# Patient Record
Sex: Female | Born: 1990 | Hispanic: No | Marital: Married | Smoking: Never smoker
Health system: Southern US, Community
[De-identification: ages and names within clinical notes are randomized; demographics above are authoritative.]

## PROBLEM LIST (undated history)

## (undated) DIAGNOSIS — Z789 Other specified health status: Secondary | ICD-10-CM

## (undated) HISTORY — PX: NO PAST SURGERIES: SHX2092

## (undated) HISTORY — DX: Other specified health status: Z78.9

---

## 2020-02-02 LAB — HM PAP SMEAR: HM Pap smear: NORMAL

## 2021-07-01 ENCOUNTER — Encounter: Payer: Self-pay | Admitting: Internal Medicine

## 2021-07-01 ENCOUNTER — Other Ambulatory Visit: Payer: Self-pay

## 2021-07-01 ENCOUNTER — Ambulatory Visit: Payer: 59 | Admitting: Internal Medicine

## 2021-07-01 VITALS — BP 93/53 | HR 57 | Temp 97.7°F | Resp 17 | Ht 60.5 in | Wt 147.4 lb

## 2021-07-01 DIAGNOSIS — Z0000 Encounter for general adult medical examination without abnormal findings: Secondary | ICD-10-CM | POA: Diagnosis not present

## 2021-07-01 DIAGNOSIS — Z6825 Body mass index (BMI) 25.0-25.9, adult: Secondary | ICD-10-CM | POA: Insufficient documentation

## 2021-07-01 DIAGNOSIS — E663 Overweight: Secondary | ICD-10-CM

## 2021-07-01 DIAGNOSIS — Z6828 Body mass index (BMI) 28.0-28.9, adult: Secondary | ICD-10-CM

## 2021-07-01 NOTE — Patient Instructions (Signed)
Mantenimiento de Technical sales engineer en [place] Maintenance, Female Adoptar un estilo de vida saludable y recibir atencin preventiva son importantes para promover la salud y Musician. Consulte al mdico sobre: El esquema adecuado para hacerse pruebas y exmenes peridicos. Cosas que puede hacer por su cuenta para prevenir enfermedades y [employer]. Qu debo saber sobre la dieta, el peso y el ejercicio? Consuma una dieta saludable  Consuma una dieta que incluya muchas verduras, frutas, productos lcteos con bajo contenido de [place] y Advertising account planner. No consuma muchos alimentos ricos en grasas slidas, azcares agregados o sodio.  Mantenga un peso saludable El ndice de masa muscular [facility]) se [place] and the [place] para identificar problemas de [place]. Proporciona una estimacin de la grasa corporal basndose en el peso y la altura. Su mdico puede ayudarle a Radiation protection practitioner [place] y a Scientist, forensic o Theatre manager unpeso saludable. Haga ejercicio con regularidad Haga ejercicio con regularidad. Esta es una de las prcticas ms importantes que puede hacer por su salud. La [employer] de los adultos deben seguir estas pautas: Optometrist, al menos, 150 minutos de actividad fsica por semana. El ejercicio debe aumentar la frecuencia cardaca y Nature conservation officer transpirar (ejercicio de intensidad moderada). Hacer ejercicios de fortalecimiento por lo [employer] por semana. Agregue esto a su plan de ejercicio de intensidad moderada. Pasar menos tiempo sentados. Incluso la actividad fsica ligera puede ser beneficiosa. Controle sus niveles de colesterol y lpidos en la sangre Comience a realizarse anlisis de lpidos y colesterol en la sangre a los20 aos y luego reptalos cada 5 aos. Hgase controlar los niveles de colesterol con mayor frecuencia si: Sus niveles de lpidos y colesterol son altos. Es mayor de 22 aos. Presenta un alto riesgo de padecer enfermedades cardacas. Qu debo saber sobre las pruebas de deteccin del  cncer? Segn su historia clnica y sus antecedentes familiares, es posible que deba realizarse pruebas de deteccin del cncer en diferentes edades. Esto puede incluir pruebas de deteccin de lo siguiente: Cncer de mama. Cncer de cuello uterino. Cncer colorrectal. Cncer de piel. Cncer de pulmn. Qu debo saber sobre la enfermedad cardaca, la diabetes y la hipertensinarterial? Presin arterial y enfermedad cardaca La hipertensin arterial causa enfermedades cardacas y [place] el riesgo de accidente cerebrovascular. Es ms probable que esto se manifieste en las personas que tienen lecturas de presin arterial alta, tienen ascendencia africana o tienen sobrepeso. Hgase controlar la presin arterial: Cada 3 a 5 aos si tiene entre 18 y 3 aos. Todos los aos si es mayor de 40 aos. Diabetes Realcese exmenes de deteccin de la diabetes con regularidad. Este anlisis revisa el nivel de azcar en la sangre en [place]. Hgase las pruebas de deteccin: Cada tres aos despus de los 22 aos de edad si tiene un peso normal y un bajo riesgo de padecer diabetes. Con ms frecuencia y a partir de [place] edad inferior si tiene sobrepeso o un alto riesgo de padecer diabetes. Qu debo saber sobre la prevencin de infecciones? Hepatitis B Si tiene un riesgo ms alto de contraer hepatitis B, debe someterse a un examen de deteccin de este virus. Hable con el mdico para averiguar si tiene riesgode contraer la infeccin por hepatitis B. Hepatitis C Se recomienda el anlisis a: [employer] 1945 y 1965. Todas las personas que tengan un riesgo de haber contrado hepatitis C. Enfermedades de transmisin sexual (ETS) Hgase las pruebas de Programme researcher, broadcasting/film/video de ITS, incluidas la gonorrea y la clamidia, si: Es sexualmente activa y es Garment/textile technologist de 24  aos. Es mayor de 46 aos, y el mdico le informa que corre riesgo de tener este tipo de infecciones. La actividad sexual ha cambiado desde que le  hicieron la ltima prueba de deteccin y tiene un riesgo mayor de Best boy clamidia o Radio broadcast assistant. Pregntele al mdico si usted tiene riesgo. Pregntele al mdico si usted tiene un alto riesgo de Museum/gallery curator VIH. El mdico tambin puede recomendarle un medicamento recetado para ayudar a evitar la infeccin por el VIH. Si elige tomar medicamentos para prevenir el VIH, primero debe [employer] de deteccin del VIH. Luego debe hacerse anlisis cada 3 meses mientras est tomando los medicamentos. Embarazo Si est por dejar de Librarian, academic (fase premenopusica) y usted puede quedar [place], busque asesoramiento antes de [place]. Tome de 400 a 800 microgramos (mcg) de cido [employer] si [place]. Pida mtodos de control de la natalidad (anticonceptivos) si desea evitar un embarazo no deseado. Osteoporosis y [place] La osteoporosis es una enfermedad en la que los huesos pierden los minerales y la fuerza por el avance de la edad. El resultado pueden ser fracturas en los [place]. Si tiene 47 aos o ms, o si est en riesgo de sufrir osteoporosis y fracturas, pregunte a su mdico si debe: Hacerse pruebas de deteccin de prdida sea. Tomar un suplemento de calcio o de vitamina D para reducir el riesgo de fracturas. Recibir terapia de reemplazo hormonal (TRH) para tratar los sntomas de la menopausia. Siga estas instrucciones en su casa: Estilo de vida No consuma ningn producto que contenga nicotina o tabaco, como cigarrillos, cigarrillos electrnicos y tabaco de Higher education careers adviser. Si necesita ayuda para dejar de fumar, consulte al mdico. No consuma drogas. No comparta agujas. Solicite ayuda a su mdico si necesita apoyo o informacin para abandonar las drogas. Consumo de alcohol No beba alcohol si: Su mdico le indica no hacerlo. Est embarazada, puede estar embarazada o est tratando de [place]. Si bebe alcohol: Limite la cantidad que consume de 0 a 1 medida por  da. Limite la ingesta si est amamantando. Est atento a la cantidad de alcohol que hay en las bebidas que toma. En los [place], una medida equivale a una botella de cerveza de 12 oz (355 ml), un vaso de vino de 5 oz (148 ml) o un vaso de una bebida alcohlica de alta graduacin de 1 oz (44 ml). Instrucciones generales Realcese los estudios de rutina de la salud, dentales y de Public librarian. [place]. Infrmele a su mdico si: Se siente deprimida con frecuencia. Alguna vez ha sido vctima de [place] o no se siente segura en su casa. Resumen Adoptar un estilo de vida saludable y recibir atencin preventiva son importantes para promover la salud y Musician. Siga las instrucciones del mdico acerca de una dieta saludable, el ejercicio y la realizacin de pruebas o exmenes para Engineer, building services. Siga las instrucciones del mdico con respecto al control del colesterol y la presin arterial. Esta informacin no tiene Marine scientist el consejo del mdico. Asegresede hacerle al mdico cualquier pregunta que tenga. Document Revised: 11/09/2018 Document Reviewed: 11/09/2018 Elsevier Patient Education  [place].

## 2021-07-01 NOTE — Progress Notes (Signed)
HPI  Patient presents the clinic today to establish care.  She would like her annual exam today.  Flu: 08/2019 Tetanus: 11/2020 Pap Smear: 2021, [place] Dentist: biannually  Diet: She does eat meat. She consumes fruits and veggies. She does eat some fried foods. She drinks mostly water. Exercise: None  History reviewed. No pertinent past medical history.  No current outpatient medications on file.   No current facility-administered medications for this visit.    No Known Allergies  History reviewed. No pertinent family history.  Social History   Socioeconomic History   Marital status: Married    Spouse name: Not on file   Number of children: Not on file   Years of education: Not on file   Highest education level: Not on file  Occupational History   Not on file  Tobacco Use   Smoking status: Never   Smokeless tobacco: Not on file  Vaping Use   Vaping Use: Never used  Substance and Sexual Activity   Alcohol use: Never   Drug use: Never   Sexual activity: Never  Other Topics Concern   Not on file  Social History Narrative   Not on file   Social Determinants of Health   Financial Resource Strain: Not on file  Food Insecurity: Not on file  Transportation Needs: Not on file  Physical Activity: Not on file  Stress: Not on file  Social Connections: Not on file  Intimate Partner Violence: Not on file    ROS:  Constitutional: Denies fever, malaise, fatigue, headache or abrupt weight changes.  HEENT: Denies eye pain, eye redness, ear pain, ringing in the ears, wax buildup, runny nose, nasal congestion, bloody nose, or sore throat. Respiratory: Denies difficulty breathing, shortness of breath, cough or sputum production.   Cardiovascular: Denies chest pain, chest tightness, palpitations or swelling in the hands or feet.  Gastrointestinal: Denies abdominal pain, bloating, constipation, diarrhea or blood in the stool.  GU: Denies frequency, urgency, pain with  urination, blood in urine, odor or discharge. Musculoskeletal: Denies decrease in range of motion, difficulty with gait, muscle pain or joint pain and swelling.  Skin: Denies redness, rashes, lesions or ulcercations.  Neurological: Denies dizziness, difficulty with memory, difficulty with speech or problems with balance and coordination.  Psych: Denies anxiety, depression, SI/HI.  No other specific complaints in a complete review of systems (except as listed in HPI above).  PE:  BP (!) 93/53 (BP Location: Right Arm, Patient Position: Sitting, Cuff Size: Normal)   Pulse (!) 57   Temp 97.7 F (36.5 C) (Temporal)   Resp 17   Ht 5' 0.5" (1.537 m)   Wt 147 lb 6.4 oz (66.9 kg)   LMP 06/23/2021   SpO2 100%   BMI 28.31 kg/m  Wt Readings from Last 3 Encounters:  07/01/21 147 lb 6.4 oz (66.9 kg)    General: Appears her stated age, overweight, in NAD. HEENT: Head: normal shape and size; Eyes: sclera white and EOMs intact;  Neck: Neck supple, trachea midline. No masses, lumps or thyromegaly present.  Cardiovascular: Normal rate and rhythm. S1,S2 noted.  No murmur, rubs or gallops noted. No JVD or BLE edema. Pulmonary/Chest: Normal effort and positive vesicular breath sounds. No respiratory distress. No wheezes, rales or ronchi noted.  Abdomen: Soft and nontender. Normal bowel sounds, no bruits noted. No distention or masses noted. Liver, spleen and kidneys non palpable. Musculoskeletal: Strength 5/5 BUE/BLE. No difficulty with gait.  Neurological: Alert and oriented. Cranial nerves II-XII grossly  intact. Coordination normal.  Psychiatric: Mood and affect normal. Behavior is normal. Judgment and thought content normal.     Assessment and Plan:  Preventative Health Maintenance:  Encouraged her to get a flu shot in the fall Tetanus UTD per her report Encouraged her to get a COVID-vaccine Pap smear UTD per her report Encouraged her to consume a balanced diet and exercise regimen Advised  her to see a dentist annually She declines lab work today  RTC in 1 year, sooner if needed Webb Silversmith, NP This visit occurred during the SARS-CoV-2 public health emergency.  Safety protocols were in place, including screening questions prior to the visit, additional usage of staff PPE, and extensive cleaning of exam room while observing appropriate contact time as indicated for disinfecting solutions.

## 2021-07-01 NOTE — Progress Notes (Signed)
HPI   No past medical history on file.  No current outpatient medications on file.   No current facility-administered medications for this visit.    No Known Allergies  No family history on file.  Social History   Socioeconomic History   Marital status: Married    Spouse name: Not on file   Number of children: Not on file   Years of education: Not on file   Highest education level: Not on file  Occupational History   Not on file  Tobacco Use   Smoking status: Never   Smokeless tobacco: Not on file  Vaping Use   Vaping Use: Never used  Substance and Sexual Activity   Alcohol use: Never   Drug use: Never   Sexual activity: Never  Other Topics Concern   Not on file  Social History Narrative   Not on file   Social Determinants of Health   Financial Resource Strain: Not on file  Food Insecurity: Not on file  Transportation Needs: Not on file  Physical Activity: Not on file  Stress: Not on file  Social Connections: Not on file  Intimate Partner Violence: Not on file    ROS:  Constitutional: Denies fever, malaise, fatigue, headache or abrupt weight changes.  HEENT: Denies eye pain, eye redness, ear pain, ringing in the ears, wax buildup, runny nose, nasal congestion, bloody nose, or sore throat. Respiratory: Denies difficulty breathing, shortness of breath, cough or sputum production.   Cardiovascular: Denies chest pain, chest tightness, palpitations or swelling in the hands or feet.  Gastrointestinal: Denies abdominal pain, bloating, constipation, diarrhea or blood in the stool.  GU: Denies frequency, urgency, pain with urination, blood in urine, odor or discharge. Musculoskeletal: Denies decrease in range of motion, difficulty with gait, muscle pain or joint pain and swelling.  Skin: Denies redness, rashes, lesions or ulcercations.  Neurological: Denies dizziness, difficulty with memory, difficulty with speech or problems with balance and coordination.  Psych:  Denies anxiety, depression, SI/HI.  No other specific complaints in a complete review of systems (except as listed in HPI above).  PE:  BP (!) 93/53 (BP Location: Right Arm, Patient Position: Sitting, Cuff Size: Normal)   Pulse (!) 57   Temp 97.7 F (36.5 C) (Temporal)   Resp 17   Ht 5' 0.5" (1.537 m)   Wt 147 lb 6.4 oz (66.9 kg)   LMP 06/23/2021   SpO2 100%   BMI 28.31 kg/m  Wt Readings from Last 3 Encounters:  07/01/21 147 lb 6.4 oz (66.9 kg)    General: Appears their stated age, well developed, well nourished in NAD. HEENT: Head: normal shape and size; Eyes: sclera white, no icterus, conjunctiva pink, PERRLA and EOMs intact; Ears: Tm's gray and intact, normal light reflex;Throat/Mouth: Teeth present, mucosa pink and moist, no lesions or ulcerations noted.  Neck: Neck supple, trachea midline. No masses, lumps or thyromegaly present.  Cardiovascular: Normal rate and rhythm. S1,S2 noted.  No murmur, rubs or gallops noted. No JVD or BLE edema. No carotid bruits noted. Pulmonary/Chest: Normal effort and positive vesicular breath sounds. No respiratory distress. No wheezes, rales or ronchi noted.  Abdomen: Soft and nontender. Normal bowel sounds, no bruits noted. No distention or masses noted. Liver, spleen and kidneys non palpable. Musculoskeletal: Normal range of motion. Strength 5/5 BUE/BLE. No signs of joint swelling. No difficulty with gait.  Neurological: Alert and oriented. Cranial nerves II-XII grossly intact. Coordination normal.  Psychiatric: Mood and affect normal. Behavior is normal. Judgment  and thought content normal.      Assessment and Plan:

## 2021-07-01 NOTE — Assessment & Plan Note (Signed)
Urged diet and exercise for weight loss

## 2021-11-02 NOTE — L&D Delivery Note (Signed)
       Delivery Note   Kim Oliver is a 31 y.o. G1P0 at 59w0dEstimated Date of Delivery: 07/09/22  PRE-OPERATIVE DIAGNOSIS:  1) 467w0dregnancy.    POST-OPERATIVE DIAGNOSIS:  1) 4164w0degnancy s/p Vaginal, Spontaneous    Delivery Type: Vaginal, Spontaneous    Delivery Anesthesia: Epidural   Labor Complications:  meconium stained fluid    ESTIMATED BLOOD LOSS: 200  ml    FINDINGS:   1) female infant, Apgar scores of 6   at 1 minute and 8   at 5 minutes. Birthweight pending  , infant skin to skin.   2) Nuchal cord: no  SPECIMENS:   PLACENTA:   Appearance: Intact , 3 vessel cord, cord blood sample collected   Removal: Spontaneous      Disposition: per protocol    DISPOSITION:  Infant to left in stable condition in the delivery room, with L&D personnel and mother,  NARRATIVE SUMMARY: Labor course:  Ms. Kim Oliver a G1P0 at 41w73w0d presented for induction of labor.  She progressed well in labor with pitocin.  She received the appropriate epidural anesthesia and proceeded to complete dilation. She evidenced good maternal expulsive effort during the second stage. She went on to deliver a viable female infant. The placenta delivered without problems and was noted to be complete. A perineal and vaginal examination was performed. Lacerations:  bilateral periurethral abrasion, vaginal abrasion that were hemostatic not in need of repair. The patient tolerated this well.  AnniPhilip AspenM  07/16/2022 6:59 PM

## 2021-11-13 ENCOUNTER — Other Ambulatory Visit: Payer: Self-pay

## 2021-11-13 ENCOUNTER — Encounter: Payer: Self-pay | Admitting: Internal Medicine

## 2021-11-13 ENCOUNTER — Ambulatory Visit: Payer: 59 | Admitting: Internal Medicine

## 2021-11-13 VITALS — BP 112/59 | HR 73 | Ht 60.5 in | Wt 143.6 lb

## 2021-11-13 DIAGNOSIS — Z3491 Encounter for supervision of normal pregnancy, unspecified, first trimester: Secondary | ICD-10-CM

## 2021-11-13 DIAGNOSIS — N926 Irregular menstruation, unspecified: Secondary | ICD-10-CM

## 2021-11-13 NOTE — Patient Instructions (Signed)
Primer trimestre de Media planner First Trimester of Pregnancy El primer trimestre de Insurance risk surveyor de su ltimo periodo menstrual hasta el final de la semana 12. Tambin se dice que va desde el mes 1 hasta el mes 3 de [place]. Dur[place] Software engineer trimestre, ocurren cambios en el cuerpo Su organismo atraviesa por muchos cambios durante el [place]. En general, los cambios vuelven a la normalidad despus del nacimiento del beb. Cambios fsicos Usted puede aumentar o bajar de [place]. Las mamas pueden agrandarse y Secretary/administrator. La zona que rodea los pezones puede oscurecerse. Pueden aparecer zonas oscuras o manchas en el rostro. Tal vez haya cambios en el cabello. Cambios en la salud Es posible que se sienta como si fuera a vomitar (nuseas) y quizs vomite. Es posible que tenga Merchant navy officer. Es posible que tenga dolores de [place]. Es posible que tenga dificultades para defecar (estreimiento). Las encas pueden sangrarle. Otros cambios Es posible que se canse con facilidad. Es posible que haga pis (orine) con mayor frecuencia. Los perodos menstruales se interrumpirn. Es posible que no tenga hambre. Es posible que [place] comer ciertos tipos de alimentos. Puede tener cambios en sus emociones de un da para Lexicographer. Es posible que tenga ms sueos. Siga estas instrucciones en su casa: Medicamentos Use los medicamentos de venta libre y los recetados solamente como se lo haya indicado el mdico. Algunos medicamentos no son seguros Solicitor. Tome vitaminas prenatales que contengan por lo menos 600 microgramos (mcg) de cido flico. Comida y bebida Consuma comidas saludables que incluyan lo siguiente: [place] Mody y verduras frescas. Cereales integrales. Buenas fuentes de protenas, como carne, huevos y tofu. Productos lcteos con bajo contenido de [place]. Evite la carne cruda y el [place], la [place] y el queso sin Radio producer. Si se siente como si fuera a vomitar: Ingiera 4 o 5  comidas pequeas por TEFL teacher de 3 abundantes. Intente comer algunas galletitas saladas. Beba lquidos [employer], en lugar de Psychiatrist. Es posible que deba tomar medidas para prevenir o tratar los problemas para defecar: Electronics engineer suficiente lquido para Contractor pis (orina) de color amarillo plido. Come alimentos ricos en fibra. Entre ellos, frijoles, cereales integrales y frutas y verduras frescas. Limitar los alimentos con alto contenido de grasa y Location manager. Estos incluyen alimentos fritos o dulces. Actividad Haga ejercicios solamente como se lo haya indicado el mdico. La mayora de las personas pueden realizar su rutina de ejercicios habitual durante el [place]. Deje de hacer ejercicios si tiene clicos o dolor en la parte baja del vientre (abdomen) o en la cintura. No haga ejercicio si hace demasiado calor, hay demasiada humedad o se encuentra en un lugar de mucha altura (altitud elevada). Evite levantar pesos [employer]. Si lo desea, puede continuar teniendo [employer], a menos que el mdico le indique lo contrario. Alivio del dolor y del Tree surgeon Use un sostn que le brinde buen soporte si le duelen las [place]. Descanse con las piernas levantadas (elevadas) si tiene calambres en las piernas o dolor en la parte baja de la espalda. Si tiene las venas de las piernas abultadas (venas varicosas): Use medias de compresin segn las indicaciones de su mdico. Levante los pies durante 15 minutos, 3 o 4 veces por Training and development officer. Limite la sal en sus alimentos. Seguridad Use el cinturn de seguridad en todo momento mientras vaya en auto. Hable con el mdico si alguien le est haciendo dao o gritando [place]. Hable con el mdico si se siente triste o  tiene pensamientos acerca de hacerse dao a usted misma. Estilo de vida No se d baos de inmersin en agua caliente, baos turcos ni saunas. No se haga duchas vaginales. No use tampones ni toallas higinicas perfumadas. No  consuma medicamentos a base de hierbas, drogas ilegales, ni medicamentos que el mdico no haya autorizado. No beba alcohol. No fume ni consuma ningn producto que contenga nicotina o tabaco. Si necesita ayuda para dejar de fumar, consulte al mdico. Evite el contacto con las bandejas sanitarias de los gatos y la tierra que estos animales usan. Estos contienen grmenes que pueden daar al beb y causar la prdida del beb ya sea aborto espontneo o muerte fetal. Instrucciones generales Cumpla con todas las visitas de seguimiento. Esto es importante. Pida ayuda si necesita asesoramiento o asistencia con la alimentacin. El mdico puede aconsejarla o indicarle dnde recurrir para recibir [place]. Visite al dentista. En su casa, lvese los dientes con un cepillo dental suave. Psese el hilo dental suavemente. Escriba sus preguntas. Llvelas cuando concurra a las visitas prenatales. Dnde buscar ms informacin American Pregnancy Association (Asociacin Americana del Embarazo): americanpregnancy.org [employer] of Obstetricians and Gynecologists (Colegio Estadounidense de Obstetras y Gineclogos): www.acog.org Office on [employer] ([place]): [email or web address].[email or web address].br Comunquese con un mdico si: Tiene mareos. Tiene fiebre. Tiene clicos leves o siente presin en la parte baja del vientre. Sufre un dolor persistente en el abdomen. Sigue sintiendo como si fuera a vomitar, vomita o hace deposiciones acuosas (diarrea) durante 24 horas o ms. Advierte lquido con mal olor que proviene de la vagina. Siente dolor al [employer]. Se expone a una enfermedad que se transmite de [place] persona a otra, como varicela, sarampin, virus de [place], VIH o hepatitis. Solicite ayuda de inmediato si: Tiene sangrado o pequeas prdidas vaginales. Tiene clicos o dolor muy intensos en el vientre. Le falta el aire o le duele el pecho. Sufre cualquier tipo de lesin, por ejemplo, debido a una  cada o un accidente automovilstico. Tiene dolor, hinchazn o enrojecimiento nuevos en un brazo o una pierna o se produce un aumento de alguno de estos sntomas. Resumen Software engineer trimestre del Insurance risk surveyor de su ltimo periodo menstrual hasta el final de la semana 12 (meses 1 al 3). Ingiera 4 o 5 comidas pequeas por TEFL teacher de 3 abundantes. No fume ni consuma ningn producto que contenga nicotina o tabaco. Si necesita ayuda para dejar de fumar, consulte al mdico. Cumpla con todas las visitas de seguimiento. Esta informacin no tiene Marine scientist el consejo del mdico. Asegrese de hacerle al mdico cualquier pregunta que tenga. Document Revised: 04/26/2020 Document Reviewed: 04/26/2020 Elsevier Patient Education  [place].

## 2021-11-13 NOTE — Progress Notes (Signed)
Subjective:    Patient ID: Kim Oliver, female    DOB: [birth year], 31 y.o.   MRN: [id number]  HPI  Pt presents to the clinic today for pregnancy confirmation. Her LMP was 10/02/21. She is G0P0. She is having breast tenderness and pelvic cramping.  She denies fatigue, nasal congestion, nausea, vomiting, constipation or vaginal bleeding.  Review of Systems     No past medical history on file.  No current outpatient medications on file.   No current facility-administered medications for this visit.    No Known Allergies  Family History  Problem Relation Age of Onset   Heart disease Paternal Grandfather    Diabetes Paternal Grandfather     Social History   Socioeconomic History   Marital status: Married    Spouse name: Not on file   Number of children: Not on file   Years of education: Not on file   Highest education level: Not on file  Occupational History   Not on file  Tobacco Use   Smoking status: Never   Smokeless tobacco: Not on file  Vaping Use   Vaping Use: Never used  Substance and Sexual Activity   Alcohol use: Never   Drug use: Never   Sexual activity: Never  Other Topics Concern   Not on file  Social History Narrative   Not on file   Social Determinants of Health   Financial Resource Strain: Not on file  Food Insecurity: Not on file  Transportation Needs: Not on file  Physical Activity: Not on file  Stress: Not on file  Social Connections: Not on file  Intimate Partner Violence: Not on file     Constitutional: Denies fever, malaise, fatigue, headache or abrupt weight changes.  HEENT: Denies eye pain, eye redness, ear pain, ringing in the ears, wax buildup, runny nose, nasal congestion, bloody nose, or sore throat. Respiratory: Denies difficulty breathing, shortness of breath, cough or sputum production.   Cardiovascular: Denies chest pain, chest tightness, palpitations or swelling in the hands or feet.  Gastrointestinal: Denies abdominal  pain, bloating, constipation, diarrhea or blood in the stool.  GU: Pt reports missed menses, pelvic cramping. Denies urgency, frequency, pain with urination, burning sensation, blood in urine, odor or discharge. Musculoskeletal: Denies decrease in range of motion, difficulty with gait, muscle pain or joint pain and swelling.  Skin: Patient reports breast tenderness.  Denies redness, rashes, lesions or ulcercations.  Neurological: Denies dizziness, difficulty with memory, difficulty with speech or problems with balance and coordination.  Psych: Denies anxiety, depression, SI/HI.  No other specific complaints in a complete review of systems (except as listed in HPI above).  Objective:   Physical Exam  BP (!) 112/59    Pulse 73    Ht 5' 0.5" (1.537 m)    Wt 143 lb 9.6 oz (65.1 kg)    SpO2 100%    BMI 27.58 kg/m   Wt Readings from Last 3 Encounters:  07/01/21 147 lb 6.4 oz (66.9 kg)    General: Appears her stated age, overweight, in NAD. Skin: Warm, dry and intact. Cardiovascular: Normal rate and rhythm. Pulmonary/Chest: Normal effort and positive vesicular breath sounds.  Musculoskeletal:  No difficulty with gait.  Neurological: Alert and oriented.  Psychiatric: Mood and affect normal. Behavior is normal. Judgment and thought content normal.       Assessment & Plan:   Missed Menses, First Trimester Pregnancy:  Urine preg test positive Discussed work and activity level Discussed medication and foods to  avoid in first trimester of pregnancy Start Prenatal vitamins Referral to [facility] for further evaluation and management  Return precautions discussed  Webb Silversmith, NP This visit occurred during the SARS-CoV-2 public health emergency.  Safety protocols were in place, including screening questions prior to the visit, additional usage of staff PPE, and extensive cleaning of exam room while observing appropriate contact time as indicated for disinfecting solutions.

## 2021-12-01 ENCOUNTER — Other Ambulatory Visit (HOSPITAL_COMMUNITY)
Admission: RE | Admit: 2021-12-01 | Discharge: 2021-12-01 | Disposition: A | Discharge status: Home / Self Care | Payer: 59 | Source: Ambulatory Visit | Attending: Obstetrics | Admitting: Obstetrics

## 2021-12-01 ENCOUNTER — Other Ambulatory Visit: Payer: Self-pay

## 2021-12-01 ENCOUNTER — Encounter: Payer: Self-pay | Admitting: Obstetrics

## 2021-12-01 ENCOUNTER — Ambulatory Visit (INDEPENDENT_AMBULATORY_CARE_PROVIDER_SITE_OTHER): Payer: 59 | Admitting: Obstetrics

## 2021-12-01 VITALS — BP 122/70 | Wt 145.0 lb

## 2021-12-01 DIAGNOSIS — Z3400 Encounter for supervision of normal first pregnancy, unspecified trimester: Secondary | ICD-10-CM | POA: Diagnosis present

## 2021-12-01 DIAGNOSIS — Z3480 Encounter for supervision of other normal pregnancy, unspecified trimester: Secondary | ICD-10-CM

## 2021-12-01 DIAGNOSIS — Z124 Encounter for screening for malignant neoplasm of cervix: Secondary | ICD-10-CM | POA: Diagnosis present

## 2021-12-01 DIAGNOSIS — Z113 Encounter for screening for infections with a predominantly sexual mode of transmission: Secondary | ICD-10-CM

## 2021-12-01 DIAGNOSIS — Z3490 Encounter for supervision of normal pregnancy, unspecified, unspecified trimester: Secondary | ICD-10-CM | POA: Insufficient documentation

## 2021-12-01 DIAGNOSIS — Z3A08 8 weeks gestation of pregnancy: Secondary | ICD-10-CM

## 2021-12-01 NOTE — Progress Notes (Signed)
New Obstetric Patient H&P    Chief Complaint: "Desires prenatal care"   History of Present Illness: Patient is a 31 y.o. G1P0 Not Hispanic or [place] female, LMP 10/02/2021 presents with amenorrhea and positive home pregnancy test. Based on her  LMP, her EDD is Estimated Date of Delivery: 07/09/22 and her EGA is [redacted]w[redacted]d. Cycles are 6. days, regular, and occur approximately every : 28 days. Her last pap smear was about 2 years ago and was no abnormalities.    She had a urine pregnancy test which was positive about 2 week(s)  ago. Her last menstrual period was normal and lasted for  6 or 7 day(s). Since her LMP she claims she has experienced breast tenderness and fatigue. She admits to some light spotting and occasional  vaginal bleeding. Her past medical history is noncontributory. Her prior pregnancies are notable for none  Since her LMP, she admits to the use of tobacco products  no She claims she has gained   no pounds since the start of her pregnancy.  There are cats in the home in the home  no She admits close contact with children on a regular basis  no  She has had chicken pox in the past yes She has had Tuberculosis exposures, symptoms, or previously tested positive for TB   no Current or past history of domestic violence. no  Genetic Screening/Teratology Counseling: (Includes patient, baby's father, or anyone in either family with:)   85. Patient's age >/= 76 [facility]  no 2. Thalassemia ([place], [place], [place], or Asian background): MCV<80  no 3. Neural tube defect (meningomyelocele, spina bifida, anencephaly)  no 4. Congenital heart defect  no  5. Down syndrome  no 6. Tay-Sachs (Jewish, [place])  no 7. Canavan's Disease  no 8. Sickle cell disease or trait (African)  no  9. Hemophilia or other blood disorders  no  10. Muscular dystrophy  no  11. Cystic fibrosis  no  12. Huntington's Chorea  no  13. Mental retardation/autism  no 14. Other inherited genetic or  chromosomal disorder  no 15. Maternal metabolic disorder (DM, PKU, etc)  no 16. Patient or FOB with a child with a birth defect not listed above no  16a. Patient or FOB with a birth defect themselves no 17. Recurrent pregnancy loss, or stillbirth  no  18. Any medications since LMP other than prenatal vitamins (include vitamins, supplements, OTC meds, drugs, alcohol)  no 19. Any other genetic/environmental exposure to discuss  no  Infection History:   1. Lives with someone with TB or TB exposed  no  2. Patient or partner has history of genital herpes  no 3. Rash or viral illness since LMP  no 4. History of STI (GC, CT, HPV, syphilis, HIV)  no 5. History of recent travel :  no  Other pertinent information:  no     Review of Systems:10 point review of systems negative unless otherwise noted in HPI  Past Medical History:  History reviewed. No pertinent past medical history.  Past Surgical History:  Past Surgical History:  Procedure Laterality Date   NO PAST SURGERIES      Gynecologic History: Patient's last menstrual period was 10/02/2021 (exact date).  Obstetric History: G1P0  Family History:  Family History  Problem Relation Age of Onset   Heart disease Paternal Grandfather    Diabetes Paternal Grandfather     Social History:  Social History   Socioeconomic History   Marital  status: Married    Spouse name: Not on file   Number of children: Not on file   Years of education: Not on file   Highest education level: Not on file  Occupational History   Not on file  Tobacco Use   Smoking status: Never   Smokeless tobacco: Not on file  Vaping Use   Vaping Use: Never used  Substance and Sexual Activity   Alcohol use: Never   Drug use: Never   Sexual activity: Yes  Other Topics Concern   Not on file  Social History Narrative   Not on file   Social Determinants of Health   Financial Resource Strain: Not on file  Food Insecurity: Not on file   Transportation Needs: Not on file  Physical Activity: Not on file  Stress: Not on file  Social Connections: Not on file  Intimate Partner Violence: Not on file    Allergies:  No Known Allergies  Medications: Prior to Admission medications   Medication Sig Start Date End Date Taking? Authorizing Provider  Prenatal Vit-Fe Fumarate-FA (PRENATAL VITAMIN PO) Take by mouth.   Yes [provider]    Physical Exam Vitals: Blood pressure 122/70, weight 145 lb (65.8 kg), last menstrual period 10/02/2021.  General: NAD HEENT: normocephalic, anicteric Thyroid: no enlargement, no palpable nodules Pulmonary: No increased work of breathing, CTAB Cardiovascular: RRR, distal pulses 2+ Abdomen: NABS, soft, non-tender, non-distended.  Umbilicus without lesions.  No hepatomegaly, splenomegaly or masses palpable. No evidence of hernia  Genitourinary:  External: Normal external female genitalia.  Normal urethral meatus, normal  Bartholin's and Skene's glands.    Vagina: Normal vaginal mucosa, no evidence of prolapse.    Cervix: Grossly normal in appearance, no bleeding  Uterus: enlarged, mobile, normal contour.  No CMT  Adnexa: ovaries non-enlarged, no adnexal masses  Rectal: deferred Extremities: no edema, erythema, or tenderness Neurologic: Grossly intact Psychiatric: mood appropriate, affect full   Assessment: 31 y.o. G1P0 at [redacted]w[redacted]d presenting to initiate prenatal care  Plan: 1) Avoid alcoholic beverages. 2) Patient encouraged not to smoke.  3) Discontinue the use of all non-medicinal drugs and chemicals.  4) Take prenatal vitamins daily.  5) Nutrition, food safety (fish, cheese advisories, and high nitrite foods) and exercise discussed. 6) Hospital and practice style discussed with cross coverage system.  7) Genetic Screening, such as with 1st Trimester Screening, cell free fetal DNA, AFP testing, and Ultrasound, as well as with amniocentesis and CVS as appropriate, is discussed  with patient. At the conclusion of today's visit patient undecided genetic testing 8) Patient is asked about travel to areas at risk for the [place] virus, and counseled to avoid travel and exposure to mosquitoes or sexual partners who may have themselves been exposed to the virus. Testing is discussed, and will be ordered as appropriate.  The following were addressed during this visit:  Breastfeeding Education - Early initiation of breastfeeding    Comments: Keeps milk supply adequate, helps contract uterus and slow bleeding, and early milk is the perfect first food and is easy to digest.   - The importance of exclusive breastfeeding    Comments: Provides antibodies, Lower risk of breast and ovarian cancers, and type-2 diabetes,Helps your body recover, Reduced chance of SIDS.   - Risks of giving your baby anything other than breast milk if you are breastfeeding    Comments: Make the baby less content with breastfeeds, may make my baby more susceptible to illness, and may reduce my milk supply.   -  Nonpharmacological pain relief methods for labor    Comments: Deep breathing, focusing on pleasant things, movement and walking, heating pads or cold compress, massage and relaxation, continuous support from someone you trust, and Doulas   - The importance of early skin-to-skin contact    Comments: Keeps baby warm and secure, helps keep babys blood sugar up and breathing steady, easier to bond and breastfeed, and helps calm baby.  - Rooming-in on a 24-hour basis    Comments: Easier to learn baby's feeding cues, easier to bond and get to know each other, and encourages milk production.   - Feeding on demand or baby-led feeding    Comments: Helps prevent breastfeeding complications, helps bring in good milk supply, prevents under or overfeeding, and helps baby feel content and satisfied   - Frequent feeding to help assure optimal milk production    Comments: Making a full supply of milk  requires frequent removal of milk from breasts, infant will eat 8-12 times in 24 hours, if separated from infant use breast massage, hand expression and/ or pumping to remove milk from breasts.   - Effective positioning and attachment    Comments: Helps my baby to get enough breast milk, helps to produce an adequate milk supply, and helps prevent nipple pain and damage   - Exclusive breastfeeding for the first 6 months    Comments: Builds a healthy milk supply and keeps it up, protects baby from sickness and disease, and breastmilk has everything your baby needs for the first 6 months.  - Individualized Education    Comments: Contraindications to breastfeeding and other special medical conditions   Imagene Riches, CNM  12/01/2021 3:56 PM

## 2021-12-01 NOTE — Progress Notes (Signed)
NOB today. LMP 12/1

## 2021-12-03 LAB — CERVICOVAGINAL ANCILLARY ONLY
Bacterial Vaginitis (gardnerella): NEGATIVE
Chlamydia: NEGATIVE
Comment: NEGATIVE
Comment: NEGATIVE
Comment: NEGATIVE
Comment: NORMAL
Neisseria Gonorrhea: NEGATIVE
Trichomonas: NEGATIVE

## 2021-12-03 LAB — URINE CULTURE, OB REFLEX: Organism ID, Bacteria: NO GROWTH

## 2021-12-03 LAB — CYTOLOGY - PAP: Diagnosis: NEGATIVE

## 2021-12-03 LAB — CULTURE, OB URINE

## 2021-12-11 ENCOUNTER — Other Ambulatory Visit: Payer: Self-pay

## 2021-12-11 ENCOUNTER — Ambulatory Visit
Admission: RE | Admit: 2021-12-11 | Discharge: 2021-12-11 | Disposition: A | Discharge status: Home / Self Care | Payer: 59 | Source: Ambulatory Visit | Attending: Obstetrics | Admitting: Obstetrics

## 2021-12-11 DIAGNOSIS — O3481 Maternal care for other abnormalities of pelvic organs, first trimester: Secondary | ICD-10-CM | POA: Diagnosis not present

## 2021-12-11 DIAGNOSIS — Z3A10 10 weeks gestation of pregnancy: Secondary | ICD-10-CM | POA: Diagnosis not present

## 2021-12-11 DIAGNOSIS — Z3687 Encounter for antenatal screening for uncertain dates: Secondary | ICD-10-CM | POA: Insufficient documentation

## 2021-12-11 DIAGNOSIS — N83291 Other ovarian cyst, right side: Secondary | ICD-10-CM | POA: Insufficient documentation

## 2021-12-11 DIAGNOSIS — Z3400 Encounter for supervision of normal first pregnancy, unspecified trimester: Secondary | ICD-10-CM

## 2021-12-20 NOTE — Progress Notes (Signed)
Would refer to MFM as well, as fibroid size or location may affect delivery planning and may require MFM type monitoring this pregnancy

## 2021-12-21 ENCOUNTER — Encounter: Payer: Self-pay | Admitting: Obstetrics

## 2021-12-22 ENCOUNTER — Ambulatory Visit (INDEPENDENT_AMBULATORY_CARE_PROVIDER_SITE_OTHER): Payer: 59 | Admitting: Obstetrics

## 2021-12-22 ENCOUNTER — Other Ambulatory Visit: Payer: Self-pay

## 2021-12-22 ENCOUNTER — Emergency Department
Admission: EM | Admit: 2021-12-22 | Discharge: 2021-12-22 | Disposition: A | Discharge status: Home / Self Care | Payer: 59 | Attending: Student in an Organized Health Care Education/Training Program | Admitting: Student in an Organized Health Care Education/Training Program

## 2021-12-22 ENCOUNTER — Emergency Department: Payer: 59

## 2021-12-22 VITALS — BP 120/78 | Wt 143.0 lb

## 2021-12-22 DIAGNOSIS — O26891 Other specified pregnancy related conditions, first trimester: Secondary | ICD-10-CM | POA: Insufficient documentation

## 2021-12-22 DIAGNOSIS — R102 Pelvic and perineal pain: Secondary | ICD-10-CM | POA: Insufficient documentation

## 2021-12-22 DIAGNOSIS — R110 Nausea: Secondary | ICD-10-CM | POA: Diagnosis not present

## 2021-12-22 DIAGNOSIS — R1031 Right lower quadrant pain: Secondary | ICD-10-CM | POA: Insufficient documentation

## 2021-12-22 DIAGNOSIS — D259 Leiomyoma of uterus, unspecified: Secondary | ICD-10-CM

## 2021-12-22 DIAGNOSIS — O3411 Maternal care for benign tumor of corpus uteri, first trimester: Secondary | ICD-10-CM

## 2021-12-22 DIAGNOSIS — Z3480 Encounter for supervision of other normal pregnancy, unspecified trimester: Secondary | ICD-10-CM

## 2021-12-22 DIAGNOSIS — Z3A11 11 weeks gestation of pregnancy: Secondary | ICD-10-CM | POA: Diagnosis not present

## 2021-12-22 DIAGNOSIS — O26899 Other specified pregnancy related conditions, unspecified trimester: Secondary | ICD-10-CM

## 2021-12-22 LAB — CBC WITH DIFFERENTIAL/PLATELET
Abs Immature Granulocytes: 0.04 10*3/uL (ref 0.00–0.07)
Basophils Absolute: 0 10*3/uL (ref 0.0–0.1)
Basophils Relative: 0 %
Eosinophils Absolute: 0.2 10*3/uL (ref 0.0–0.5)
Eosinophils Relative: 1 %
HCT: 37.7 % (ref 36.0–46.0)
Hemoglobin: 12.7 g/dL (ref 12.0–15.0)
Immature Granulocytes: 0 %
Lymphocytes Relative: 16 %
Lymphs Abs: 1.9 10*3/uL (ref 0.7–4.0)
MCH: 32.4 pg (ref 26.0–34.0)
MCHC: 33.7 g/dL (ref 30.0–36.0)
MCV: 96.2 fL (ref 80.0–100.0)
Monocytes Absolute: 0.7 10*3/uL (ref 0.1–1.0)
Monocytes Relative: 6 %
Neutro Abs: 9.1 10*3/uL — ABNORMAL HIGH (ref 1.7–7.7)
Neutrophils Relative %: 77 %
Platelets: 260 10*3/uL (ref 150–400)
RBC: 3.92 MIL/uL (ref 3.87–5.11)
RDW: 13.1 % (ref 11.5–15.5)
WBC: 12 10*3/uL — ABNORMAL HIGH (ref 4.0–10.5)
nRBC: 0 % (ref 0.0–0.2)

## 2021-12-22 LAB — URINALYSIS, ROUTINE W REFLEX MICROSCOPIC
Bilirubin Urine: NEGATIVE
Glucose, UA: NEGATIVE mg/dL
Ketones, ur: 80 mg/dL — AB
Nitrite: NEGATIVE
Protein, ur: NEGATIVE mg/dL
Specific Gravity, Urine: 1.019 (ref 1.005–1.030)
pH: 6 (ref 5.0–8.0)

## 2021-12-22 LAB — COMPREHENSIVE METABOLIC PANEL
ALT: 16 U/L (ref 0–44)
AST: 17 U/L (ref 15–41)
Albumin: 4.1 g/dL (ref 3.5–5.0)
Alkaline Phosphatase: 44 U/L (ref 38–126)
Anion gap: 7 (ref 5–15)
BUN: 10 mg/dL (ref 6–20)
CO2: 23 mmol/L (ref 22–32)
Calcium: 9.1 mg/dL (ref 8.9–10.3)
Chloride: 102 mmol/L (ref 98–111)
Creatinine, Ser: 0.46 mg/dL (ref 0.44–1.00)
GFR, Estimated: >60 mL/min (ref >60)
Glucose, Bld: 86 mg/dL (ref 70–99)
Potassium: 3.5 mmol/L (ref 3.5–5.1)
Sodium: 132 mmol/L — ABNORMAL LOW (ref 135–145)
Total Bilirubin: 0.4 mg/dL (ref 0.3–1.2)
Total Protein: 7.9 g/dL (ref 6.5–8.1)

## 2021-12-22 LAB — HCG, QUANTITATIVE, PREGNANCY: hCG, Beta Chain, Quant, S: 168913 m[IU]/mL — ABNORMAL HIGH (ref <5)

## 2021-12-22 MED ORDER — ACETAMINOPHEN 325 MG PO TABS
650.0000 mg | ORAL_TABLET | Freq: Once | ORAL | Status: AC
  Administered 2021-12-22: 650 mg via ORAL
  Filled 2021-12-22: qty 2

## 2021-12-22 MED ORDER — CEPHALEXIN 500 MG PO CAPS: 500.0000 mg | ORAL_CAPSULE | Freq: Three times a day (TID) | ORAL | 0 refills | Status: AC

## 2021-12-22 MED ORDER — ONDANSETRON HCL 4 MG/2ML IJ SOLN
4.0000 mg | Freq: Once | INTRAMUSCULAR | Status: AC
  Administered 2021-12-22: 4 mg via INTRAVENOUS
  Filled 2021-12-22: qty 2

## 2021-12-22 MED ORDER — CEPHALEXIN 500 MG PO CAPS
500.0000 mg | ORAL_CAPSULE | Freq: Once | ORAL | Status: AC
  Administered 2021-12-22: 500 mg via ORAL
  Filled 2021-12-22: qty 1

## 2021-12-22 MED ORDER — ONDANSETRON 4 MG PO TBDP: 4.0000 mg | ORAL_TABLET | Freq: Three times a day (TID) | ORAL | 0 refills | Status: DC | PRN

## 2021-12-22 MED ORDER — MORPHINE SULFATE (PF) 4 MG/ML IV SOLN
4.0000 mg | Freq: Once | INTRAVENOUS | Status: AC
  Administered 2021-12-22: 4 mg via INTRAVENOUS
  Filled 2021-12-22: qty 1

## 2021-12-22 NOTE — ED Provider Notes (Signed)
°  Physical Exam  BP 115/66    Pulse 66    Temp 98.3 F (36.8 C) (Oral)    Resp 17    LMP 10/02/2021 (Exact Date)    SpO2 100%   Physical Exam  Procedures  Procedures  ED Course / MDM    Medical Decision Making Amount and/or Complexity of Data Reviewed Labs: ordered. Radiology: ordered.  Risk OTC drugs. Prescription drug management.   I assumed over care of this patient from Ashok Cordia, PA-C at approximately 2000.  Plan is to await MRI results and discharge if negative.  Will need surgical consult if positive.  Contacted by radiologist, who stated that there will not be a formal read of the MRI for the patient due to no official MRI radiology staffing.  However, radiologist over the phone performed a wet read and advised that there is some trace amount of fluid in the lower abdomen, though does not appear to be concerning for appendicitis.  Patient does have findings suggestive of cystitis.  Dr. Quentin Cornwall spoke with the patient and recommended IV fluids, however she states that she is ready to go home.  We will plan to discharge this patient with OB/GYN follow-up and antibiotics.       Teodoro Spray, [place] 12/23/21 [id number]    Merlyn Lot, MD 12/23/21 2004

## 2021-12-22 NOTE — ED Triage Notes (Addendum)
Pt presents to ED POV with c/o of RLQ pain for 1 week.Pt states she is [redacted] weeks pregnant at this time. Pt denies any vaginal bleeding.   Mobile interpreter used in triage

## 2021-12-22 NOTE — Progress Notes (Signed)
Work in [employer] pt, having bad in lower right abdomen since Friday. No vb. No lof.

## 2021-12-22 NOTE — ED Provider Triage Note (Signed)
Emergency Medicine Provider Triage Evaluation Note  Kim Oliver , a 31 y.o. female  was evaluated in triage.  Pt complains of right lower quadrant pain.  Patient is [redacted] weeks pregnant.  No vaginal bleeding or discharge.  Review of Systems  Positive: Right lower quadrant pain Negative: Vaginal bleeding, discharge  Physical Exam  BP 113/66 (BP Location: Right Arm)    Pulse 71    Resp 15    LMP 10/02/2021 (Exact Date)    SpO2 100%  Gen:   Awake, no distress   Resp:  Normal effort  MSK:   Moves extremities without difficulty  Other:  Abdomen is tender in right lower quadrant  Medical Decision Making  Medically screening exam initiated at 3:01 PM.  Appropriate orders placed.  Holy Battenfield was informed that the remainder of the evaluation will be completed by another provider, this initial triage assessment does not replace that evaluation, and the importance of remaining in the ED until their evaluation is complete.  Patient is [redacted] weeks pregnant will get ultrasound OB less than 14 weeks, consider appendicitis if negative   Versie Starks, PA-C 12/22/21 1502

## 2021-12-22 NOTE — ED Provider Notes (Signed)
[facility] Provider Note    Event Date/Time   First MD Initiated Contact with Patient 12/22/21 1650     (approximate)   History   Abdominal Pain   HPI  Kim Oliver is a 31 y.o. female presents emergency department with right lower quadrant pain.  Patient is [redacted] weeks pregnant.  No vaginal bleeding.  Had a ultrasound last week to prove IUP.  States the pain is severe.  No fever      Physical Exam   Triage Vital Signs: ED Triage Vitals  Enc Vitals Group     BP 12/22/21 1456 113/66     Pulse Rate 12/22/21 1456 71     Resp 12/22/21 1456 15     Temp 12/22/21 1505 98.3 F (36.8 C)     Temp Source 12/22/21 1505 Oral     SpO2 12/22/21 1456 100 %     Weight --      Height --      Head Circumference --      Peak Flow --      Pain Score 12/22/21 1457 7     Pain Loc --      Pain Edu? --      Excl. in [place]? --     Most recent vital signs: Vitals:   12/22/21 1456 12/22/21 1505  BP: 113/66   Pulse: 71   Resp: 15   Temp:  98.3 F (36.8 C)  SpO2: 100%      General: Awake, no distress.   CV:  Good peripheral perfusion. regular rate and  rhythm Resp:  Normal effort. Lungs CTA Abd:  No distention.  Right lower quadrant tenderness, positive McBurney's point tenderness Other:      ED Results / Procedures / Treatments   Labs (all labs ordered are listed, but only abnormal results are displayed) Labs Reviewed  COMPREHENSIVE METABOLIC PANEL - Abnormal; Notable for the following components:      Result Value   Sodium 132 (*)    All other components within normal limits  CBC WITH DIFFERENTIAL/PLATELET - Abnormal; Notable for the following components:   WBC 12.0 (*)    Neutro Abs 9.1 (*)    All other components within normal limits  HCG, QUANTITATIVE, PREGNANCY - Abnormal; Notable for the following components:   hCG, Beta Chain, Laqueta Carina 168,913 (*)    All other components within normal limits  URINALYSIS, ROUTINE W REFLEX MICROSCOPIC      EKG     RADIOLOGY Ultrasound ob less than 14 weeks Mri abdomen for appendicitis     PROCEDURES:   Procedures   MEDICATIONS ORDERED IN ED: Medications  morphine (PF) 4 MG/ML injection 4 mg (4 mg Intravenous Given 12/22/21 1517)  ondansetron (ZOFRAN) injection 4 mg (4 mg Intravenous Given 12/22/21 1517)     IMPRESSION / MDM / ASSESSMENT AND PLAN / ED COURSE  I reviewed the triage vital signs and the nursing notes.                              Differential diagnosis includes, but is not limited to, ectopic pregnancy, miscarriage, acute appendicitis, bowel obstruction  CBC has mild elevation of WBC of 12, comprehensive metabolic panel is normal, beta-hCG is 168,913  Ultrasound OB less than 14 weeks was reviewed by me I do not see any acute abnormality.  Read as normal for IUP per radiologist  MRI of the abdomen  and pelvis without contrast for appendicitis and pregnancy  Results are still pending.  I did independently review the areas and concerned that there is fluid near the appendix.  Will await radiology read.  Care transferred to Mardee Postin, PA-C  Plan is to discharge if MRI is negative, if positive will have to consult surgery.       FINAL CLINICAL IMPRESSION(S) / ED DIAGNOSES   Final diagnoses:  RLQ abdominal pain     Rx / DC Orders   ED Discharge Orders     None        Note:  This document was prepared using Dragon voice recognition software and may include unintentional dictation errors.    Versie Starks, PA-C 12/22/21 1958    Merlyn Lot, MD 12/22/21 Maudry Diego    Merlyn Lot, MD 12/22/21 [phone]

## 2021-12-22 NOTE — Telephone Encounter (Signed)
Work in at 1:15 pm today.

## 2021-12-22 NOTE — Progress Notes (Signed)
Obstetrics & Gynecology Office Visit   Chief Complaint:  Chief Complaint  Patient presents with   Routine Prenatal Visit    History of Present Illness: Kim Oliver presents at [redacted] weeks gestation with acute lower right abdominal pain that started about one week ago. She is newly pregnant, and a recent dating ultrasound revealed several fibroids and one complex ovarian cyst. She is worked in today. An interpretor is present to translate. Kim Oliver describes right abdominal pain that she a rates a 7 out of 10. It is stabbing, and she can feel something on her abdomen that feels swollen to her. She denies any vaginal bleeding or cramping.   Review of Systems:  Review of Systems  Constitutional: Negative.   HENT: Negative.    Eyes: Negative.   Respiratory: Negative.    Cardiovascular: Negative.   Gastrointestinal:  Positive for abdominal pain and nausea.  Genitourinary: Negative.   Musculoskeletal: Negative.   Skin: Negative.   Neurological: Negative.   Endo/Heme/Allergies: Negative.   Psychiatric/Behavioral: Negative.      Past Medical History:  No past medical history on file.  Past Surgical History:  Past Surgical History:  Procedure Laterality Date   NO PAST SURGERIES      Gynecologic History: Patient's last menstrual period was 10/02/2021 (exact date).  Obstetric History: G1P0  Family History:  Family History  Problem Relation Age of Onset   Heart disease Paternal Grandfather    Diabetes Paternal Grandfather     Social History:  Social History   Socioeconomic History   Marital status: Married    Spouse name: Not on file   Number of children: Not on file   Years of education: Not on file   Highest education level: Not on file  Occupational History   Not on file  Tobacco Use   Smoking status: Never   Smokeless tobacco: Not on file  Vaping Use   Vaping Use: Never used  Substance and Sexual Activity   Alcohol use: Never   Drug use: Never   Sexual activity: Yes   Other Topics Concern   Not on file  Social History Narrative   Not on file   Social Determinants of Health   Financial Resource Strain: Not on file  Food Insecurity: Not on file  Transportation Needs: Not on file  Physical Activity: Not on file  Stress: Not on file  Social Connections: Not on file  Intimate Partner Violence: Not on file    Allergies:  No Known Allergies  Medications: Prior to Admission medications   Medication Sig Start Date End Date Taking? Authorizing Provider  Prenatal Vit-Fe Fumarate-FA (PRENATAL VITAMIN PO) Take by mouth.    [provider]    Physical Exam Vitals:  Vitals:   12/22/21 1311  BP: 120/78   Patient's last menstrual period was 10/02/2021 (exact date).  Physical Exam Constitutional:      Appearance: Normal appearance. She is normal weight.  HENT:     Head: Normocephalic and atraumatic.  Cardiovascular:     Rate and Rhythm: Normal rate and regular rhythm.  Pulmonary:     Effort: Pulmonary effort is normal.     Breath sounds: Normal breath sounds.  Abdominal:     Comments: Gravid. Abdominal exam: Normal bowel sounds. She is tender in the lower right quadrant, and there is a palpable enlarged area located in the RLQ. It is sensitive to the touch with both light and deeper palpation No diarrhea or vomiting noted today. Some nausea.  Genitourinary:    General: Normal vulva.     Rectum: Normal.     Comments: Bimanual exam- her uterus is midline, and enlarged Very tender in the right adnexal area, no pain in the left adnexal. No vaginal bleeding noted. Musculoskeletal:        General: Normal range of motion.     Cervical back: Normal range of motion and neck supple.  Skin:    General: Skin is warm and dry.  Neurological:     General: No focal deficit present.     Mental Status: She is alert and oriented to person, place, and time.     Assessment: 31 y.o. G1P0  at 11 weeks 4 days gestaton Acute abdomen Right complex  cyst and multiple fibroids noted on recent ultrasound Suspicious for ovarian cyst, or appendicitis  Plan: Problem List Items Addressed This Visit       Other   Supervision of other normal pregnancy, antepartum   Other Visit Diagnoses     Uterine fibroids affecting pregnancy in first trimester    -  Primary   Relevant Orders   AMB referral to maternal fetal medicine     Fetal heart tones are reassuring today (150s). She is sent to the [facility] ED for evaluation. A call is made to the charge nurse at [facility] to alert the ED of her arrival.  Imagene Riches, [facility]  12/22/2021 5:08 PM

## 2021-12-22 NOTE — Discharge Instructions (Signed)

## 2021-12-23 NOTE — Addendum Note (Signed)
Addended by: Meryl Dare on: 12/23/2021 12:53 PM   Modules accepted: Orders

## 2021-12-30 ENCOUNTER — Encounter: Payer: Self-pay | Admitting: Advanced Practice Midwife

## 2021-12-30 ENCOUNTER — Other Ambulatory Visit: Payer: Self-pay

## 2021-12-30 ENCOUNTER — Encounter: Payer: 59 | Admitting: Obstetrics

## 2021-12-30 ENCOUNTER — Ambulatory Visit (INDEPENDENT_AMBULATORY_CARE_PROVIDER_SITE_OTHER): Payer: 59 | Admitting: Advanced Practice Midwife

## 2021-12-30 VITALS — BP 118/70 | Ht 60.5 in | Wt 146.4 lb

## 2021-12-30 DIAGNOSIS — Z1379 Encounter for other screening for genetic and chromosomal anomalies: Secondary | ICD-10-CM

## 2021-12-30 DIAGNOSIS — Z369 Encounter for antenatal screening, unspecified: Secondary | ICD-10-CM

## 2021-12-30 DIAGNOSIS — Z3A12 12 weeks gestation of pregnancy: Secondary | ICD-10-CM

## 2021-12-30 DIAGNOSIS — Z3481 Encounter for supervision of other normal pregnancy, first trimester: Secondary | ICD-10-CM

## 2021-12-30 LAB — POCT URINALYSIS DIPSTICK OB
Glucose, UA: NEGATIVE
POC,PROTEIN,UA: NEGATIVE

## 2021-12-30 NOTE — Patient Instructions (Signed)
Segundo trimestre de [employer] Second Trimester of Pregnancy El segundo trimestre de Media planner va desde la semana 13 hasta la semana 27. Es Software engineer desde el mes 6 hasta el mes 8 de [place]. El segundo trimestre suele ser el momento en el que mejor se siente. Su organismo se ha adaptado a Public relations account executive, y comienza a Print production planner. Durante el segundo trimestre: Las nuseas del embarazo han disminuido o han desaparecido completamente. Usted puede tener ms energa. Es posible que tenga un aumento del apetito. El segundo trimestre es tambin un perodo en el que el beb en gestacin (feto) crece rpidamente. Hacia el final del sexto mes, el feto puede medir aproximadamente 12 pulgadas y pesar alrededor de 1 libras. Es probable que sienta que el beb se mueve (da pataditas) entre las 16 y 77 semanas del Media planner. Cambios en el cuerpo durante el segundo trimestre Su cuerpo continua experimentando numerosos cambios durante su segundo trimestre. Los cambios varan y generalmente vuelven a la normalidad despus del nacimiento del beb. Cambios fsicos Seguir [employer]. Notar que la parte baja del abdomen sobresale. Podrn aparecer las primeras Apache Corporation caderas, el abdomen y las [place]. Las [place] National Corporation seguirn creciendo y se tornarn sensibles. Pueden aparecer zonas oscuras o manchas (cloasma o mscara del embarazo) en el rostro. Es posible que se forme una lnea oscura desde el ombligo hasta la zona del pubis (linea nigra). Tal vez haya cambios en el cabello. Esto cambios pueden incluir su engrosamiento, crecimiento rpido y Harley[employer]son textura. A algunas personas tambin se les cae el cabello durante o despus del [place], o tienen el cabello seco o fino. Cambios en la salud Comienza a tener dolores de [place]. Es posible que tenga acidez estomacal. Puede tener estreimiento. Pueden aparecer hemorroides o abultarse e hincharse las venas (venas varicosas). Las Control and instrumentation engineer y estar sensibles al cepillado y al hilo dental. Letitia Caul vez tenga necesidad de orinar con ms frecuencia porque el feto est ejerciendo presin sobre la vejiga. Puede sentir dolor en la espalda. Esto se debe a: Aumento de peso. Las hormonas del [place] articulaciones en la pelvis. Un cambio en el peso y los msculos que ayudan a Theatre manager su equilibrio. Siga estas instrucciones en su casa: [place] instrucciones del mdico en relacin con el uso de medicamentos. Durante el embarazo, hay medicamentos que pueden tomarse y 68 que no. No tome medicamentos a menos que lo haya autorizado el mdico. Tome vitaminas prenatales que contengan por lo menos 600 microgramos (mcg) de cido flico. Comida y bebida Lleve una dieta saludable que incluya frutas y verduras frescas, cereales integrales, buenas fuentes de protenas como carnes Wisdom, huevos o tofu, y productos lcteos descremados. Evite la carne cruda y el [place], la [place] y el queso sin Radio producer. Estos portan grmenes que pueden provocar dao tanto a usted como al beb. Es posible que tenga que tomar estas medidas para prevenir o tratar el estreimiento: Electronics engineer suficiente lquido como para Theatre manager la orina de color amarillo plido. Consumir alimentos ricos en fibra, como frijoles, cereales integrales, y frutas y verduras frescas. Limitar el consumo de alimentos ricos en grasa y azcares procesados, como los alimentos fritos o dulces. Actividad Haga ejercicio solamente como se lo haya indicado el mdico. La mayora de las personas pueden continuar su rutina de ejercicios durante el [place]. Intente realizar como mnimo 30 minutos de actividad fsica por lo menos 5 das a la [place]. Deje de hacer ejercicio si comienza a  tener Designer, jewellery. Deje de hacer ejercicio si le aparecen dolor o clicos en la parte baja del vientre o de la espalda. Evite hacer ejercicio si hace mucho calor o humedad, o si se  encuentra a una altitud elevada. Evite levantar pesos [employer]. Si lo desea, puede seguir teniendo [employer]ot, salvo que el mdico le indique lo contrario. Alivio del dolor y del Tree surgeon Use un sujetador que le brinde buen soporte para prevenir las molestias causadas por la sensibilidad en las [place]. Dese baos de asiento con agua tibia para Best boy o las molestias causadas por las hemorroides. Use una crema para las hemorroides si el mdico la autoriza. Descanse con las piernas levantadas (elevadas) si tiene calambres en las piernas o dolor en la parte baja de la espalda. Si tiene venas varicosas: Use medias de compresin como se lo haya indicado el mdico. Eleve los pies durante 15 minutos, 3 o 4 veces por da. Limite el consumo de sal en su dieta. Seguridad Use el cinturn de seguridad en todo momento mientras conduce o va en auto. Hable con el mdico si es vctima de Terex Corporation o fsico. Estilo de vida No se d baos de inmersin en agua caliente, baos turcos ni saunas. No se haga duchas vaginales. No use tampones ni toallas higinicas perfumadas. Evite el contacto con las bandejas sanitarias de los gatos y la tierra que estos animales usan. Estos elementos contienen bacterias que pueden causar defectos congnitos al beb y la posible prdida del feto debido a un aborto espontneo o muerte fetal. No use remedios a base de hierbas, alcohol, drogas ilegales ni medicamentos que no estn aprobados por el mdico. Las sustancias qumicas de estos productos pueden daar al beb. No consuma ningn producto que contenga nicotina o tabaco, como cigarrillos, cigarrillos electrnicos y tabaco de Higher education careers adviser. Si necesita ayuda para dejar de fumar, consulte al mdico. Instrucciones generales Durante una visita prenatal de rutina, el Viacom har un examen fsico y Equities trader. Tambin le hablar sobre su salud general. Cumpla con todas las visitas de seguimiento. Esto es  importante. Pdale al mdico que la derive a clases de educacin prenatal en su localidad. Pida ayuda si tiene necesidades nutricionales o de asesoramiento Solicitor. El mdico puede aconsejarla o derivarla a especialistas para que la ayuden con diferentes necesidades. Dnde buscar ms informacin American Pregnancy Association (Asociacin Americana del Embarazo): americanpregnancy.org SPX Corporation of Obstetricians and Gynecologists (Colegio Estadounidense de Obstetras y [place]): SwimmingTub.com.br? Office on Home Depot ([place]): KeywordPortfolios.com.br Comunquese con un mdico si tiene: Un dolor de cabeza que no desaparece despus de Teacher, adult education. Cambios en la visin o ve manchas delante de los ojos. Clicos leves, presin en la pelvis o dolor persistente en el abdomen. Nuseas persistentes, vmitos o diarrea. Secrecin vaginal con mal olor u orina con USAA ftido. Dolor al [place]. Hinchazn sbita o extrema del rostro, las manos, los tobillos, los pies o las piernas. Cristy Hilts. Busque ayuda de inmediato si: Tiene una prdida de lquido por la vagina. Tiene sangrado ligero o manchas vaginales. Tiene dolor intenso o clicos en el abdomen. Presenta dificultad para respirar. Siente dolor en el pecho. Tiene episodios de Kimberly[employer]ark. No ha sentido a su beb moverse durante el perodo de Agilent Technologies indic el mdico. Tiene dolor, hinchazn o enrojecimiento nuevos o ms intensos en un brazo o una pierna. Resumen El segundo trimestre de embarazo va desde la semana 37 hasta la 27 (  desde el mes 4 hasta el 6). No use remedios a base de hierbas, alcohol, drogas ilegales ni medicamentos que no estn aprobados por el mdico. Las sustancias qumicas de estos productos pueden daar al beb. Haga ejercicio solamente como se lo haya indicado el mdico. La mayora de las personas pueden continuar su rutina de ejercicios durante el  [place]. Cumpla con todas las visitas de seguimiento. Esto es importante. Esta informacin no tiene Marine scientist el consejo del mdico. Asegrese de hacerle al mdico cualquier pregunta que tenga. Document Revised: 04/29/2020 Document Reviewed: 04/29/2020 Elsevier Patient Education  [place].

## 2021-12-30 NOTE — Progress Notes (Signed)
Routine Prenatal Care Visit  Subjective  Kim Oliver is a 31 y.o. G1P0 at [redacted]w[redacted]d being seen today for ongoing prenatal care.  She is currently monitored for the following issues for this low-risk pregnancy and has Overweight with body mass index (BMI) of 28 to 28.9 in adult and Supervision of other normal pregnancy, antepartum on their problem list.  ----------------------------------------------------------------------------------- Patient reports feeling better since taking antibiotics for UTI.    . Vag. Bleeding: None.   . Leaking Fluid denies.  ----------------------------------------------------------------------------------- The following portions of the patient's history were reviewed and updated as appropriate: allergies, current medications, past family history, past medical history, past social history, past surgical history and problem list. Problem list updated.  Objective  Blood pressure 118/70, height 5' 0.5" (1.537 m), weight 146 lb 6.4 oz (66.4 kg), last menstrual period 10/02/2021. Pregravid weight 145 lb (65.8 kg) Total Weight Gain 1 lb 6.4 oz (0.635 kg) Urinalysis: Urine Protein    Urine Glucose    Fetal Status: Fetal Heart Rate (bpm): 159         General:  Alert, oriented and cooperative. Patient is in no acute distress.  Skin: Skin is warm and dry. No rash noted.   Cardiovascular: Normal heart rate noted  Respiratory: Normal respiratory effort, no problems with respiration noted  Abdomen: Soft, gravid, appropriate for gestational age.       Pelvic:  Cervical exam deferred        Extremities: Normal range of motion.  Edema: None  Mental Status: Normal mood and affect. Normal behavior. Normal judgment and thought content.   Assessment   31 y.o. G1P0 at [redacted]w[redacted]d by  07/09/2022, by Last Menstrual Period presenting for routine prenatal visit  Plan   pregnancy Problems (from 12/01/21 to present)    Problem Noted Resolved   Supervision of other normal pregnancy,  antepartum 12/01/2021 by Imagene Riches, CNM No   Overview Addendum 12/22/2021  2:00 PM by Imagene Riches, CNM     Nursing Staff Provider  Office Location  [place] LMP dating, EDD per LMP 07/09/2022  Language  English Anatomy US    Flu Vaccine   Genetic Screen  NIPS:   TDaP vaccine    Hgb A1C or  GTT Early : Third trimester :   Covid    LAB RESULTS   Rhogam   Blood Type     Feeding Plan  Antibody    Contraception  Rubella    Circumcision  RPR     Pediatrician   HBsAg     Support Person  HIV    Prenatal Classes  Varicella     GBS  (For PCN allergy, check sensitivities)   BTL Consent     VBAC Consent  Pap  NILM    Hgb Electro      CF      SMA                  Preterm labor symptoms and general obstetric precautions including but not limited to vaginal bleeding, contractions, leaking of fluid and fetal movement were reviewed in detail with the patient. Please refer to After Visit Summary for other counseling recommendations.  MaterniT 21 today  Return in about 4 weeks (around 01/27/2022) for rob.  Rod Can, CNM 12/30/2021 3:18 PM

## 2022-01-01 ENCOUNTER — Ambulatory Visit (HOSPITAL_BASED_OUTPATIENT_CLINIC_OR_DEPARTMENT_OTHER): Payer: 59 | Admitting: Maternal & Fetal Medicine

## 2022-01-01 ENCOUNTER — Ambulatory Visit: Payer: 59 | Admitting: *Deleted

## 2022-01-01 ENCOUNTER — Ambulatory Visit: Payer: 59 | Attending: Obstetrics

## 2022-01-01 ENCOUNTER — Encounter: Payer: Self-pay | Admitting: *Deleted

## 2022-01-01 ENCOUNTER — Other Ambulatory Visit: Payer: Self-pay

## 2022-01-01 ENCOUNTER — Other Ambulatory Visit: Payer: Self-pay | Admitting: *Deleted

## 2022-01-01 VITALS — BP 109/55 | HR 65

## 2022-01-01 DIAGNOSIS — D259 Leiomyoma of uterus, unspecified: Secondary | ICD-10-CM

## 2022-01-01 DIAGNOSIS — Z3A13 13 weeks gestation of pregnancy: Secondary | ICD-10-CM | POA: Diagnosis not present

## 2022-01-01 DIAGNOSIS — O3412 Maternal care for benign tumor of corpus uteri, second trimester: Secondary | ICD-10-CM | POA: Diagnosis not present

## 2022-01-01 DIAGNOSIS — Z362 Encounter for other antenatal screening follow-up: Secondary | ICD-10-CM

## 2022-01-01 DIAGNOSIS — O3411 Maternal care for benign tumor of corpus uteri, first trimester: Secondary | ICD-10-CM | POA: Diagnosis present

## 2022-01-01 NOTE — Progress Notes (Signed)
MFM Consultation ? ?Ms. Kim Oliver is a 31 yo G1P0 who is at 63 w 0d dated by LMP consistent with a 10 week ultrasound. ? ?She is seen today at the request of Gigi Gin, CNM for uterine fibroids. ? ?Ms. Kim Oliver is overall doing well without complaints. She denies s/sx of threatened abortion. She is undecided regarding genetic screening. ? ?She reports that her medical history is uncomplicated. She denies substance abuse and takes prenatal vitamins. ? ?Today we observes a single intrauterine pregnancy with measurements consistent with dates. ?There are no markers of aneuploidy observed.  ?We did observe 4 uterine fibroids as noted above. The largest posterior lower uterine segment areas is ~5 cm. ? ?I reviewed the images real time with Ms. Kim Oliver and her significant other.  ? ?I discussed that this fibroid has the potential of impeding delivery depending on its growth.  ? ?In regards to intrauterine leiomyomas, We discussed some of the adverse perinatal outcome associated with fibroids in pregnancy, mainly increased risk for preterm delivery, abruption, and intrauterine growth restriction. In addition, we reviewed the signs and symptoms of fibroid degeneration. ? ?At this time, we recommended serial ultrasounds for growth at approximately 4-6 week intervals ? ?A detailed examination is scheduled in at 18-20 weeks, She should have serial growth exams at 28/32/36 weeks.  ? ?I spent 30 minutes with > 50 % in face to face consultation. ? ?Vikki Ports, MD ?

## 2022-01-05 LAB — MATERNIT21 PLUS CORE+SCA
Fetal Fraction: 8
Monosomy X (Turner Syndrome): NOT DETECTED
Result (T21): NEGATIVE
Trisomy 13 (Patau syndrome): NEGATIVE
Trisomy 18 (Edwards syndrome): NEGATIVE
Trisomy 21 (Down syndrome): NEGATIVE
XXX (Triple X Syndrome): NOT DETECTED
XXY (Klinefelter Syndrome): NOT DETECTED
XYY (Jacobs Syndrome): NOT DETECTED

## 2022-01-27 ENCOUNTER — Ambulatory Visit (INDEPENDENT_AMBULATORY_CARE_PROVIDER_SITE_OTHER): Payer: 59 | Admitting: Licensed Practical Nurse

## 2022-01-27 ENCOUNTER — Encounter: Payer: Self-pay | Admitting: Licensed Practical Nurse

## 2022-01-27 ENCOUNTER — Other Ambulatory Visit: Payer: Self-pay

## 2022-01-27 ENCOUNTER — Other Ambulatory Visit (HOSPITAL_COMMUNITY)
Admission: RE | Admit: 2022-01-27 | Discharge: 2022-01-27 | Disposition: A | Discharge status: Home / Self Care | Payer: 59 | Source: Ambulatory Visit | Attending: Licensed Practical Nurse | Admitting: Licensed Practical Nurse

## 2022-01-27 VITALS — BP 110/56 | Wt 148.0 lb

## 2022-01-27 DIAGNOSIS — Z3A16 16 weeks gestation of pregnancy: Secondary | ICD-10-CM

## 2022-01-27 DIAGNOSIS — R300 Dysuria: Secondary | ICD-10-CM

## 2022-01-27 DIAGNOSIS — Z3480 Encounter for supervision of other normal pregnancy, unspecified trimester: Secondary | ICD-10-CM

## 2022-01-27 DIAGNOSIS — N949 Unspecified condition associated with female genital organs and menstrual cycle: Secondary | ICD-10-CM

## 2022-01-27 DIAGNOSIS — O0990 Supervision of high risk pregnancy, unspecified, unspecified trimester: Secondary | ICD-10-CM

## 2022-01-27 DIAGNOSIS — Z369 Encounter for antenatal screening, unspecified: Secondary | ICD-10-CM

## 2022-01-27 LAB — POCT URINALYSIS DIPSTICK
Bilirubin, UA: NEGATIVE
Blood, UA: POSITIVE
Glucose, UA: NEGATIVE
Ketones, UA: NEGATIVE
Nitrite, UA: NEGATIVE
Protein, UA: POSITIVE — AB
Spec Grav, UA: 1.01 (ref 1.010–1.025)
Urobilinogen, UA: NEGATIVE E.U./dL — AB
pH, UA: 6 (ref 5.0–8.0)

## 2022-01-27 LAB — POCT URINALYSIS DIPSTICK OB: Glucose, UA: NEGATIVE

## 2022-01-27 NOTE — Progress Notes (Signed)
Routine Prenatal Care Visit ? ?Subjective  ?Kim Oliver is a 31 y.o. G1P0 at 45w5dbeing seen today for ongoing prenatal care.  She is currently monitored for the following issues for this high-risk pregnancy and has Overweight with body mass index (BMI) of 28 to 28.9 in adult and Supervision of other normal pregnancy, antepartum on their problem list.  ? ?Spanish interpretor present for entire visit and exam  ?----------------------------------------------------------------------------------- ?Patient reports heartburn happens every time she eats, the discomfort starts in her stomach and goes up her throat. She has tried drinking cold water. It seems any food bother her.  Comfort measures reviewed, encouraged use of OTC antacid.  ?-Has had vaginal burning with urination, also has a hard time urinating x 1 week.  Denies any vaginal odor but has noticed a light green thin discharge.  ?-does not have anatomy scan scheduled yet.  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 Comfort measures reviewed.  ? ?Contractions: Not present.  .  Movement: Absent. Leaking Fluid denies.  ?----------------------------------------------------------------------------------- ?The following portions of the patient's history were reviewed and updated as appropriate: allergies, current medications, past family history, past medical history, past social history, past surgical history and problem list. Problem list updated. ? ?Objective  ?Blood pressure (!) 110/56, weight 148 lb (67.1 kg), last menstrual period 10/02/2021. ?Pregravid weight 145 lb (65.8 kg) Total Weight Gain 3 lb (1.361 kg) ?Urinalysis: Urine Protein Trace  Urine Glucose Negative trace leuks and blood  ? ?Fetal Status: Fetal Heart Rate (bpm): 140   Movement: Absent    ? ?General:  Alert, oriented and cooperative. Patient is in no acute distress.   ?Skin: Skin is warm and dry. No rash noted.   ?Cardiovascular: Normal heart rate noted  ?Respiratory: Normal respiratory effort, no problems with respiration noted  ?Abdomen: Soft, gravid, appropriate for gestational age. Pain/Pressure: Absent     ?Pelvic:  Cervical exam deferred      external genitalia normal healthy tissue no lesions, spec exam: cervix pink, no lesions, white discharge  a little adherent to cervix present   ?Extremities: Normal range of motion.  Edema: None  ?Mental Status: Normal mood and affect. Normal behavior. Normal judgment and thought content.  ? ?Assessment  ? ?31y.o. G1P0 at 174w5dy  07/09/2022, by Last Menstrual Period presenting for routine prenatal visit ? ?Plan  ? ?pregnancy Problems (from 12/01/21 to present)   ? ? Problem Noted Resolved  ? Supervision of other normal pregnancy, antepartum 12/01/2021 by FrImagene RichesCNM No  ? Overview Addendum 01/27/2022  3:20 PM by DoAllen DerryCNM  ?   ?Nursing Staff Provider  ?[place] 07/09/2022  ?Language  spanish Anatomy [place]  ?Flu Vaccine   Genetic Screen  NIPS:   ?TDaP vaccine    Hgb A1C or  ?GTT Early : ?Third trimester :   ?Covid    LAB RESULTS   ?Rhogam   Blood Type     ?Feeding Plan  Antibody    ?Contraception  Rubella    ?Circumcision  RPR     ?Pediatrician   HBsAg     ?Support Person  HIV    ?Prenatal Classes  Varicella   ?  GBS  (For PCN allergy, check sensitivities)   ?BTL Consent     ?  VBAC Consent  Pap  NILM  ?  Hgb Electro    ?  CF   ?   SMA   ?     ? ?  ?  ? ?  ?  ? ?general obstetric precautions including but not limited to vaginal bleeding, contractions, leaking of fluid and fetal movement were reviewed in detail with the patient. ?Please refer to After Visit Summary for other counseling recommendations.  ? ?Return in about 4 weeks (around 02/24/2022) for [place]. ? ?Anatomy US ordered at MFM ? ?Urine culture and aptima swabs collected/sent  ? ?Needs NOB labs collected at  next visit, lab tech not available to draw blood during today's appointment.  ? ?Letter to transfer departments at  work given  ? ?Roberto Scales, CNM  ?Kim Oliver, [facility]  ?01/27/22  ?3:38 PM  ? ? ?

## 2022-01-27 NOTE — Progress Notes (Signed)
C/o a lot of acid reflux after finishes eating; lower back hurting really bad - thinks it's from the work she is doing now; last 2wks has had UTI - burning inside. ?

## 2022-01-29 ENCOUNTER — Telehealth: Payer: Self-pay

## 2022-01-29 LAB — CERVICOVAGINAL ANCILLARY ONLY
Bacterial Vaginitis (gardnerella): NEGATIVE
Candida Glabrata: NEGATIVE
Candida Vaginitis: NEGATIVE
Chlamydia: NEGATIVE
Comment: NEGATIVE
Comment: NEGATIVE
Comment: NEGATIVE
Comment: NEGATIVE
Comment: NORMAL
Neisseria Gonorrhea: NEGATIVE

## 2022-01-29 NOTE — Telephone Encounter (Signed)
Patient arrived in office to report her Employer is requesting a more detailed work note to specify how many weeks and her due date. More detailed note provided. See Letters for details. ?

## 2022-01-30 LAB — URINE CULTURE

## 2022-02-19 ENCOUNTER — Other Ambulatory Visit: Payer: Self-pay

## 2022-02-19 DIAGNOSIS — D259 Leiomyoma of uterus, unspecified: Secondary | ICD-10-CM

## 2022-02-24 ENCOUNTER — Other Ambulatory Visit: Payer: 59

## 2022-02-24 ENCOUNTER — Encounter: Payer: Self-pay | Admitting: Family Medicine

## 2022-02-24 ENCOUNTER — Ambulatory Visit (INDEPENDENT_AMBULATORY_CARE_PROVIDER_SITE_OTHER): Payer: 59 | Admitting: Family Medicine

## 2022-02-24 ENCOUNTER — Ambulatory Visit: Payer: 59 | Attending: Maternal & Fetal Medicine

## 2022-02-24 ENCOUNTER — Other Ambulatory Visit: Payer: Self-pay

## 2022-02-24 ENCOUNTER — Ambulatory Visit: Payer: 59

## 2022-02-24 VITALS — BP 118/79 | HR 97 | Temp 97.9°F | Ht 65.0 in | Wt 152.5 lb

## 2022-02-24 VITALS — BP 118/62 | Wt 154.0 lb

## 2022-02-24 DIAGNOSIS — Z6828 Body mass index (BMI) 28.0-28.9, adult: Secondary | ICD-10-CM

## 2022-02-24 DIAGNOSIS — Z363 Encounter for antenatal screening for malformations: Secondary | ICD-10-CM | POA: Diagnosis not present

## 2022-02-24 DIAGNOSIS — E663 Overweight: Secondary | ICD-10-CM

## 2022-02-24 DIAGNOSIS — Z3480 Encounter for supervision of other normal pregnancy, unspecified trimester: Secondary | ICD-10-CM

## 2022-02-24 DIAGNOSIS — O3412 Maternal care for benign tumor of corpus uteri, second trimester: Secondary | ICD-10-CM | POA: Diagnosis present

## 2022-02-24 DIAGNOSIS — M549 Dorsalgia, unspecified: Secondary | ICD-10-CM

## 2022-02-24 DIAGNOSIS — K219 Gastro-esophageal reflux disease without esophagitis: Secondary | ICD-10-CM

## 2022-02-24 DIAGNOSIS — D259 Leiomyoma of uterus, unspecified: Secondary | ICD-10-CM | POA: Diagnosis not present

## 2022-02-24 DIAGNOSIS — Z3A20 20 weeks gestation of pregnancy: Secondary | ICD-10-CM

## 2022-02-24 DIAGNOSIS — O3410 Maternal care for benign tumor of corpus uteri, unspecified trimester: Secondary | ICD-10-CM | POA: Insufficient documentation

## 2022-02-24 DIAGNOSIS — O99891 Other specified diseases and conditions complicating pregnancy: Secondary | ICD-10-CM | POA: Insufficient documentation

## 2022-02-24 LAB — POCT URINALYSIS DIPSTICK OB
Glucose, UA: NEGATIVE
POC,PROTEIN,UA: NEGATIVE

## 2022-02-24 MED ORDER — COMFORT FIT MATERNITY SUPP MED MISC: 1.0000 [IU] | Freq: Once | 0 refills | Status: AC

## 2022-02-24 NOTE — Progress Notes (Signed)
? ? ?  PRENATAL VISIT NOTE ? ?Subjective:  ?Kim Oliver is a 31 y.o. G1P0 at 56w5dbeing seen today for ongoing prenatal care.  She is currently monitored for the following issues for this low-risk pregnancy and has Overweight with body mass index (BMI) of 28 to 28.9 in adult; Supervision of other normal pregnancy, antepartum; Uterine fibroid during pregnancy, antepartum; Gastroesophageal reflux disease without esophagitis; and Back pain affecting pregnancy in second trimester on their problem list. ? ?Patient reports backache, heartburn, and rash on arms .  Contractions: Not present. Vag. Bleeding: None.  Movement: Present. Denies leaking of fluid.  ? ?The following portions of the patient's history were reviewed and updated as appropriate: allergies, current medications, past family history, past medical history, past social history, past surgical history and problem list.  ? ?Objective:  ? ?Vitals:  ? 02/24/22 1533  ?BP: 118/62  ?Weight: 154 lb (69.9 kg)  ? ? ?Fetal Status: Fetal Heart Rate (bpm): 150 Fundal Height: 20 cm Movement: Present    ? ?General:  Alert, oriented and cooperative. Patient is in no acute distress.  ?Skin: Skin is warm and dry. No rash noted.   ?Cardiovascular: Normal heart rate noted  ?Respiratory: Normal respiratory effort, no problems with respiration noted  ?Abdomen: Soft, gravid, appropriate for gestational age.  Pain/Pressure: Absent     ?Pelvic: Cervical exam deferred        ?Extremities: Normal range of motion.  Edema: None  ?Mental Status: Normal mood and affect. Normal behavior. Normal judgment and thought content.  ? ?Assessment and Plan:  ?Pregnancy: G1P0 at 230w5d1. Supervision of other normal pregnancy, antepartum ?Needs New OB panel-- ordered today  and drawn  ?[place] WNL, having boy! ? ?Realized patient needed additional labs-- never had prenatal panel. Only labs are from the hospital, which showed WNL CBC, CMP and Ucx.  ? ?Has rash on arms-- not itchy, painful or other  sx, mostly hyperpigmentation. Likely normal and counseled as such ? ?Having lower abdominal pain with back pain-- worse with prolonged standing. Recommend pregnancy support band and given RX to bring to [facility] or [facility] ? ?Acid reflux-- had tried tums but not helping. Recommended Pepcid BID with TUMs PRN.  ? ?2. Overweight with body mass index (BMI) of 28 to 28.9 in adult ?TWG=9 lb (4.082 kg)  ? ?3. Fibroids ?- monitor with regular [place] ?Preterm labor symptoms and general obstetric precautions including but not limited to vaginal bleeding, contractions, leaking of fluid and fetal movement were reviewed in detail with the patient. ?Please refer to After Visit Summary for other counseling recommendations.  ? ?Return in about 4 weeks (around 03/24/2022) for Routine prenatal care. ? ?Future Appointments  ?Date Time Provider [place]?02/25/2022  2:40 PM [facility] None  ?03/24/2022  2:35 PM FrImagene RichesCNM [facility] None  ?04/16/2022  3:00 PM [facility]  ?05/14/2022  2:00 PM [facility]  ?06/11/2022  3:00 PM [facility]  ? ? ?KiCaren MacadamMD ?

## 2022-02-25 ENCOUNTER — Other Ambulatory Visit: Payer: 59

## 2022-02-26 LAB — RPR+RH+ABO+RUB AB+AB SCR+CB...
Antibody Screen: NEGATIVE
HIV Screen 4th Generation wRfx: NONREACTIVE
Hematocrit: 33.1 % — ABNORMAL LOW (ref 34.0–46.6)
Hemoglobin: 11.6 g/dL (ref 11.1–15.9)
Hepatitis B Surface Ag: NEGATIVE
MCH: 34.2 pg — ABNORMAL HIGH (ref 26.6–33.0)
MCHC: 35 g/dL (ref 31.5–35.7)
MCV: 98 fL — ABNORMAL HIGH (ref 79–97)
Platelets: 327 10*3/uL (ref 150–450)
RBC: 3.39 x10E6/uL — ABNORMAL LOW (ref 3.77–5.28)
RDW: 13.1 % (ref 11.7–15.4)
RPR Ser Ql: NONREACTIVE
Rh Factor: POSITIVE
Rubella Antibodies, IGG: 1.86 index (ref >0.99)
Varicella zoster IgG: 1246 index (ref >165)
WBC: 11 10*3/uL — ABNORMAL HIGH (ref 3.4–10.8)

## 2022-03-24 ENCOUNTER — Ambulatory Visit (INDEPENDENT_AMBULATORY_CARE_PROVIDER_SITE_OTHER): Payer: 59 | Admitting: Obstetrics

## 2022-03-24 VITALS — BP 120/80 | Wt 159.0 lb

## 2022-03-24 DIAGNOSIS — Z3A24 24 weeks gestation of pregnancy: Secondary | ICD-10-CM

## 2022-03-24 DIAGNOSIS — O26812 Pregnancy related exhaustion and fatigue, second trimester: Secondary | ICD-10-CM

## 2022-03-24 DIAGNOSIS — Z3480 Encounter for supervision of other normal pregnancy, unspecified trimester: Secondary | ICD-10-CM

## 2022-03-24 NOTE — Progress Notes (Signed)
Routine Prenatal Care Visit  Subjective  Kim Oliver is a 31 y.o. G1P0 at 66w5dbeing seen today for ongoing prenatal care.  She is currently monitored for the following issues for this high-risk pregnancy and has Overweight with body mass index (BMI) of 28 to 28.9 in adult; Supervision of other normal pregnancy, antepartum; Uterine fibroid during pregnancy, antepartum; Gastroesophageal reflux disease without esophagitis; and Back pain affecting pregnancy in second trimester on their problem list.  ----------------------------------------------------------------------------------- Patient reports no complaints.   Contractions: Not present. Vag. Bleeding: None.  Movement: Present. Leaking Fluid denies.  ----------------------------------------------------------------------------------- The following portions of the patient's history were reviewed and updated as appropriate: allergies, current medications, past family history, past medical history, past social history, past surgical history and problem list. Problem list updated.  Objective  Blood pressure 120/80, weight 159 lb (72.1 kg), last menstrual period 10/02/2021. Pregravid weight 145 lb (65.8 kg) Total Weight Gain 14 lb (6.35 kg) Urinalysis: Urine Protein    Urine Glucose    Fetal Status:     Movement: Present     General:  Alert, oriented and cooperative. Patient is in no acute distress.  Skin: Skin is warm and dry. No rash noted.   Cardiovascular: Normal heart rate noted  Respiratory: Normal respiratory effort, no problems with respiration noted  Abdomen: Soft, gravid, appropriate for gestational age. Pain/Pressure: Absent     Pelvic:  Cervical exam deferred        Extremities: Normal range of motion.     Mental Status: Normal mood and affect. Normal behavior. Normal judgment and thought content.   Assessment   31y.o. G1P0 at 219w5dy  07/09/2022, by Last Menstrual Period presenting for routine prenatal visit  Plan    pregnancy Problems (from 12/01/21 to present)    Problem Noted Resolved   Supervision of other normal pregnancy, antepartum 12/01/2021 by FrImagene RichesCNM No   Overview Addendum 02/24/2022  5:44 PM by Kim MacadamMD     Nursing Staff Provider  [place] per LMP 07/09/2022  Language  spanish Anatomy [place]  Flu Vaccine   Genetic Screen  NIPS: low risk female  TDaP vaccine    Hgb A1C or  GTT Early : Third trimester :   Covid    LAB RESULTS   Rhogam   Blood Type     Feeding Plan  Antibody    Contraception  Rubella    Circumcision  RPR     Pediatrician   HBsAg     Support Person  HIV    Prenatal Classes  Varicella     GBS  (For PCN allergy, check sensitivities)   BTL Consent     VBAC Consent  Pap  NILM    Hgb Electro      CF      SMA                  Preterm labor symptoms and general obstetric precautions including but not limited to vaginal bleeding, contractions, leaking of fluid and fetal movement were reviewed in detail with the patient. Please refer to After Visit Summary for other counseling recommendations.   No follow-ups on file.  MaImagene RichesCNM  03/24/2022 2:46 PM

## 2022-03-25 LAB — CBC WITH DIFFERENTIAL
Basophils Absolute: 0 10*3/uL (ref 0.0–0.2)
Basos: 0 %
EOS (ABSOLUTE): 0.3 10*3/uL (ref 0.0–0.4)
Eos: 2 %
Hematocrit: 32.9 % — ABNORMAL LOW (ref 34.0–46.6)
Hemoglobin: 11.7 g/dL (ref 11.1–15.9)
Immature Grans (Abs): 0.2 10*3/uL — ABNORMAL HIGH (ref 0.0–0.1)
Immature Granulocytes: 1 %
Lymphocytes Absolute: 1.8 10*3/uL (ref 0.7–3.1)
Lymphs: 13 %
MCH: 34.9 pg — ABNORMAL HIGH (ref 26.6–33.0)
MCHC: 35.6 g/dL (ref 31.5–35.7)
MCV: 98 fL — ABNORMAL HIGH (ref 79–97)
Monocytes Absolute: 1 10*3/uL — ABNORMAL HIGH (ref 0.1–0.9)
Monocytes: 7 %
Neutrophils Absolute: 10.6 10*3/uL — ABNORMAL HIGH (ref 1.4–7.0)
Neutrophils: 77 %
RBC: 3.35 x10E6/uL — ABNORMAL LOW (ref 3.77–5.28)
RDW: 13.2 % (ref 11.7–15.4)
WBC: 13.9 10*3/uL — ABNORMAL HIGH (ref 3.4–10.8)

## 2022-03-26 ENCOUNTER — Other Ambulatory Visit: Payer: Self-pay

## 2022-03-26 DIAGNOSIS — D259 Leiomyoma of uterus, unspecified: Secondary | ICD-10-CM

## 2022-04-07 ENCOUNTER — Ambulatory Visit: Payer: 59 | Admitting: Internal Medicine

## 2022-04-07 ENCOUNTER — Encounter: Payer: Self-pay | Admitting: Internal Medicine

## 2022-04-07 VITALS — BP 104/62 | HR 78 | Temp 97.5°F | Wt 162.0 lb

## 2022-04-07 DIAGNOSIS — M67442 Ganglion, left hand: Secondary | ICD-10-CM

## 2022-04-07 NOTE — Patient Instructions (Signed)
Ganglion Cyst  A ganglion cyst is a non-cancerous, fluid-filled lump of tissue that occurs near a joint, tendon, or ligament. The cyst grows out of a joint or the lining of a tendon or ligament. Ganglion cysts most often develop in the hand or wrist, but they can also develop in the shoulder, elbow, hip, knee, ankle, or foot. Ganglion cysts are ball-shaped or egg-shaped. Their size can range from the size of a pea to larger than a grape. Increased activity may cause the cyst to get bigger because more fluid starts to build up. What are the causes? The exact cause of this condition is not known, but it may be related to: Inflammation or irritation around the joint. An injury or tear in the layers of tissue around the joint (joint capsule). Repetitive movements or overuse. History of acute or repeated injury. What increases the risk? You are more likely to develop this condition if: You are a female. You are 74-36 years old. What are the signs or symptoms? The main symptom of this condition is a lump. It most often appears on the hand or wrist. In many cases, there are no other symptoms, but a cyst can sometimes cause: Tingling. Pain or tenderness. Numbness. Weakness or loss of strength in the affected joint. Decreased range of motion in the affected area of the body. How is this diagnosed? Ganglion cysts are usually diagnosed based on a physical exam. Your health care provider will feel the lump and may shine a light next to it. If it is a ganglion cyst, the light will likely shine through it. Your health care provider may order an X-ray, ultrasound, MRI, or CT scan to rule out other conditions. How is this treated? Ganglion cysts often go away on their own without treatment. If you have pain or other symptoms, treatment may be needed. Treatment is also needed if the ganglion cyst limits your movement or if it gets infected. Treatment may include: Wearing a brace or splint on your wrist or  finger. Taking anti-inflammatory medicine. Having fluid drained from the lump with a needle (aspiration). Getting an injection of medicine into the joint to decrease inflammation. This may be corticosteroids, ethanol, or hyaluronidase. Having surgery to remove the ganglion cyst. Placing a pad in your shoe or wearing shoes that will not rub against the cyst if it is on your foot. Follow these instructions at home: Do not press on the ganglion cyst, poke it with a needle, or hit it. Take over-the-counter and prescription medicines only as told by your health care provider. If you have a brace or splint: Wear it as told by your health care provider. Remove it as told by your health care provider. Ask if you need to remove it when you take a shower or a bath. Watch your ganglion cyst for any changes. Keep all follow-up visits as told by your health care provider. This is important. Contact a health care provider if: Your ganglion cyst becomes larger or more painful. You have pus coming from the lump. You have weakness or numbness in the affected area. You have a fever or chills. Get help right away if: You have a fever and have any of these in the cyst area: Increased redness. Red streaks. Swelling. Summary A ganglion cyst is a non-cancerous, fluid-filled lump that occurs near a joint, tendon, or ligament. Ganglion cysts most often develop in the hand or wrist, but they can also develop in the shoulder, elbow, hip, knee, ankle, or  foot. Ganglion cysts often go away on their own without treatment. This information is not intended to replace advice given to you by your health care provider. Make sure you discuss any questions you have with your health care provider. Document Revised: 01/10/2020 Document Reviewed: 01/10/2020 Elsevier Patient Education  Kim Oliver.

## 2022-04-07 NOTE — Progress Notes (Signed)
Subjective:    Patient ID: Kim Oliver, female    DOB: 1991-02-08, 31 y.o.   MRN: [id number]  HPI  Patient presents to clinic today with complaint of a cyst on her left middle finger.  She noticed this 1 year ago. She reports the cyst has gotten bigger in size. The cyst is causing her some discomfort, especially when she tries to bend her finger. She has not tried anything OTC for this.  Review of Systems     Past Medical History:  Diagnosis Date   Medical history non-contributory     Current Outpatient Medications  Medication Sig Dispense Refill   ondansetron (ZOFRAN-ODT) 4 MG disintegrating tablet Take 1 tablet (4 mg total) by mouth every 8 (eight) hours as needed for nausea or vomiting. 20 tablet 0   Prenatal Vit-Fe Fumarate-FA (PRENATAL VITAMIN PO) Take by mouth.     No current facility-administered medications for this visit.    No Known Allergies  Family History  Problem Relation Age of Onset   Heart disease Paternal Grandfather    Diabetes Paternal Grandfather     Social History   Socioeconomic History   Marital status: Married    Spouse name: Not on file   Number of children: Not on file   Years of education: Not on file   Highest education level: Not on file  Occupational History   Occupation: Packing    Comment: Sales executive  Tobacco Use   Smoking status: Never   Smokeless tobacco: Not on file  Vaping Use   Vaping Use: Never used  Substance and Sexual Activity   Alcohol use: Never   Drug use: Never   Sexual activity: Yes  Other Topics Concern   Not on file  Social History Narrative   Not on file   Social Determinants of Health   Financial Resource Strain: Not on file  Food Insecurity: Not on file  Transportation Needs: Not on file  Physical Activity: Not on file  Stress: Not on file  Social Connections: Not on file  Intimate Partner Violence: Not on file     Constitutional: Denies fever, malaise, fatigue, headache or abrupt weight  changes.  Respiratory: Denies difficulty breathing, shortness of breath, cough or sputum production.   Cardiovascular: Denies chest pain, chest tightness, palpitations or swelling in the hands or feet.  Musculoskeletal: Denies decrease in range of motion, difficulty with gait, muscle pain or joint pain and swelling.  Skin: Patient reports cyst of left middle finger.  Denies redness, rashes, or ulcercations.    No other specific complaints in a complete review of systems (except as listed in HPI above).  Objective:   Physical Exam  BP 104/62 (BP Location: Left Arm, Patient Position: Sitting, Cuff Size: Normal)   Pulse 78   Temp (!) 97.5 F (36.4 C) (Temporal)   Wt 162 lb (73.5 kg)   LMP 10/02/2021 (Exact Date)   SpO2 98%   Breastfeeding Unknown   BMI 26.96 kg/m   Wt Readings from Last 3 Encounters:  03/24/22 159 lb (72.1 kg)  02/24/22 154 lb (69.9 kg)  02/24/22 152 lb 8 oz (69.2 kg)    General: Appears her stated age, pregnant in NAD. Skin: Warm, dry and intact. 1 cm ganglion cyst noted at base of left middle finger. Cardiovascular: Normal rate. Pulmonary/Chest: Normal effort and positive vesicular breath sounds.  Musculoskeletal: Normal flexion and extension of the left middle finger. Neurological: Alert and oriented.  BMET    Component Value  Date/Time   NA 132 (L) 12/22/2021 1502   K 3.5 12/22/2021 1502   CL 102 12/22/2021 1502   CO2 23 12/22/2021 1502   GLUCOSE 86 12/22/2021 1502   BUN 10 12/22/2021 1502   CREATININE 0.46 12/22/2021 1502   CALCIUM 9.1 12/22/2021 1502   GFRNONAA >60 12/22/2021 1502    Lipid Panel  No results found for: CHOL, TRIG, HDL, CHOLHDL, VLDL, LDLCALC  CBC    Component Value Date/Time   WBC 13.9 (H) 03/24/2022 1554   WBC 12.0 (H) 12/22/2021 1502   RBC 3.35 (L) 03/24/2022 1554   RBC 3.92 12/22/2021 1502   HGB 11.7 03/24/2022 1554   HCT 32.9 (L) 03/24/2022 1554   PLT 327 02/25/2022 1448   MCV 98 (H) 03/24/2022 1554   MCH 34.9 (H)  03/24/2022 1554   MCH 32.4 12/22/2021 1502   MCHC 35.6 03/24/2022 1554   MCHC 33.7 12/22/2021 1502   RDW 13.2 03/24/2022 1554   LYMPHSABS 1.8 03/24/2022 1554   MONOABS 0.7 12/22/2021 1502   EOSABS 0.3 03/24/2022 1554   BASOSABS 0.0 03/24/2022 1554    Hgb A1C No results found for: HGBA1C          Assessment & Plan:   Ganglion Cyst of Finger:  Referral to orthopedics (hand specialist) for further evaluation and treatment  RTC in 2 months for your annual exam   Webb Silversmith, NP

## 2022-04-16 ENCOUNTER — Encounter: Payer: 59 | Admitting: Obstetrics

## 2022-04-16 ENCOUNTER — Other Ambulatory Visit: Payer: 59

## 2022-04-16 ENCOUNTER — Ambulatory Visit: Payer: 59 | Attending: Obstetrics

## 2022-04-16 ENCOUNTER — Ambulatory Visit (INDEPENDENT_AMBULATORY_CARE_PROVIDER_SITE_OTHER): Payer: 59 | Admitting: Obstetrics

## 2022-04-16 ENCOUNTER — Encounter: Payer: Self-pay | Admitting: Obstetrics

## 2022-04-16 ENCOUNTER — Other Ambulatory Visit: Payer: Self-pay

## 2022-04-16 VITALS — BP 118/60 | Wt 162.0 lb

## 2022-04-16 DIAGNOSIS — Z3400 Encounter for supervision of normal first pregnancy, unspecified trimester: Secondary | ICD-10-CM

## 2022-04-16 DIAGNOSIS — Z23 Encounter for immunization: Secondary | ICD-10-CM

## 2022-04-16 DIAGNOSIS — Z3A28 28 weeks gestation of pregnancy: Secondary | ICD-10-CM | POA: Diagnosis not present

## 2022-04-16 DIAGNOSIS — O3412 Maternal care for benign tumor of corpus uteri, second trimester: Secondary | ICD-10-CM

## 2022-04-16 DIAGNOSIS — Z3480 Encounter for supervision of other normal pregnancy, unspecified trimester: Secondary | ICD-10-CM

## 2022-04-16 DIAGNOSIS — D259 Leiomyoma of uterus, unspecified: Secondary | ICD-10-CM | POA: Insufficient documentation

## 2022-04-16 DIAGNOSIS — Z363 Encounter for antenatal screening for malformations: Secondary | ICD-10-CM | POA: Insufficient documentation

## 2022-04-16 DIAGNOSIS — R12 Heartburn: Secondary | ICD-10-CM

## 2022-04-16 DIAGNOSIS — O26899 Other specified pregnancy related conditions, unspecified trimester: Secondary | ICD-10-CM

## 2022-04-16 DIAGNOSIS — R399 Unspecified symptoms and signs involving the genitourinary system: Secondary | ICD-10-CM

## 2022-04-16 DIAGNOSIS — O3410 Maternal care for benign tumor of corpus uteri, unspecified trimester: Secondary | ICD-10-CM

## 2022-04-16 LAB — POCT URINALYSIS DIPSTICK OB
Bilirubin, UA: NEGATIVE
Blood, UA: POSITIVE
Glucose, UA: NEGATIVE
Ketones, UA: NEGATIVE
Leukocytes, UA: NEGATIVE
Nitrite, UA: NEGATIVE
POC,PROTEIN,UA: NEGATIVE
Spec Grav, UA: 1.01 (ref 1.010–1.025)
Urobilinogen, UA: 0.2 E.U./dL
pH, UA: 5 (ref 5.0–8.0)

## 2022-04-16 MED ORDER — FAMOTIDINE 20 MG PO TABS: 20.0000 mg | ORAL_TABLET | Freq: Every day | ORAL | 0 refills | Status: DC

## 2022-04-16 NOTE — Addendum Note (Signed)
Addended by: Inis Sizer on: 04/16/2022 11:23 AM   Modules accepted: Orders

## 2022-04-16 NOTE — Progress Notes (Signed)
Subjective: Patient returns for pregnancy follow-up. She has no concerns.  She reports good fetal movement, denies vaginal bleeding or leakage of fluid. She denies contractions. She denies current urinary symptoms. She did have burning and pressure a few days ago but resolved. She has started to have breast let down.  Denies headaches, blurred vision or epigastric pain.   Objective: BP 118/60   Wt 162 lb (73.5 kg)   LMP 10/02/2021 (Exact Date)   BMI 26.96 kg/m  Physical Exam Vitals and nursing note reviewed.  Constitutional:      Appearance: Normal appearance.  HENT:     Head: Normocephalic and atraumatic.  Eyes:     Extraocular Movements: Extraocular movements intact.  Pulmonary:     Effort: Pulmonary effort is normal.  Abdominal:     Palpations: Mass: gravid FH 31.  Musculoskeletal:        General: Normal range of motion.     Cervical back: Normal range of motion.  Skin:    General: Skin is warm and dry.  Neurological:     General: No focal deficit present.     Mental Status: She is alert and oriented to person, place, and time.  Psychiatric:        Mood and Affect: Mood normal.        Behavior: Behavior normal.        Thought Content: Thought content normal.    Assessment and Plan:  Patient Kim Oliver is an 31 y.o. year old G1P0 at 74w0dwith EDC of Estimated Date of Delivery: 07/09/22 with Supervision of normal first pregnancy, antepartum - Plan: POC Urinalysis Dipstick OB, Hepatitis C antibody  [redacted] weeks gestation of pregnancy - Plan: POC Urinalysis Dipstick OB  UTI symptoms - Plan: Urine Culture  Heartburn during pregnancy, antepartum - Plan: famotidine (PEPCID) 20 MG tablet  Uterine fibroid during pregnancy, antepartum  Need for diphtheria-tetanus-pertussis (Tdap) vaccine  Labs: O pos RI VI Glucola labs today  Its a boy considering circ "Thiago" Genetics: CFDNA low risk MSAFP missed Immunizations: tdap today s/p COVID did not yet get flu vac  S/p level  2 [place]ore[place]ith fibroids present: 4/25 - [id number] gm 37%ile transverse anatomy wnl multiple fibroids largest 6 x 7.5 x 6.5'  Has follow up growth [place] Pepcid for heartburn  Will send urine cx  Return in about 2 weeks (around 04/30/2022) for routine OB.

## 2022-04-16 NOTE — Patient Instructions (Signed)
Please call if you have elevated blood pressure, vision changes, headache, right-sided upper abdominal pain.  Please call if you have contractions occurring every 5-10 minutes for 1-2 hours, if you have vaginal bleeding, if watery fluid is leaking from your vagina, or if you have decreased fetal movement.  If you are not yet signed up on [place], please ask [place] how to sign up for it!

## 2022-04-17 LAB — 28 WEEK RH+PANEL
Basophils Absolute: 0 10*3/uL (ref 0.0–0.2)
Basos: 0 %
EOS (ABSOLUTE): 0.1 10*3/uL (ref 0.0–0.4)
Eos: 1 %
Gestational Diabetes Screen: 112 mg/dL (ref 70–139)
HIV Screen 4th Generation wRfx: NONREACTIVE
Hematocrit: 36 % (ref 34.0–46.6)
Hemoglobin: 12.5 g/dL (ref 11.1–15.9)
Immature Grans (Abs): 0.1 10*3/uL (ref 0.0–0.1)
Immature Granulocytes: 1 %
Lymphocytes Absolute: 1.4 10*3/uL (ref 0.7–3.1)
Lymphs: 11 %
MCH: 34.6 pg — ABNORMAL HIGH (ref 26.6–33.0)
MCHC: 34.7 g/dL (ref 31.5–35.7)
MCV: 100 fL — ABNORMAL HIGH (ref 79–97)
Monocytes Absolute: 0.7 10*3/uL (ref 0.1–0.9)
Monocytes: 5 %
Neutrophils Absolute: 10.5 10*3/uL — ABNORMAL HIGH (ref 1.4–7.0)
Neutrophils: 82 %
Platelets: 266 10*3/uL (ref 150–450)
RBC: 3.61 x10E6/uL — ABNORMAL LOW (ref 3.77–5.28)
RDW: 13 % (ref 11.7–15.4)
RPR Ser Ql: NONREACTIVE
WBC: 12.7 10*3/uL — ABNORMAL HIGH (ref 3.4–10.8)

## 2022-04-17 LAB — HEPATITIS C ANTIBODY: Hep C Virus Ab: NONREACTIVE

## 2022-04-18 LAB — URINE CULTURE: Organism ID, Bacteria: NO GROWTH

## 2022-05-01 ENCOUNTER — Encounter: Payer: Self-pay | Admitting: Advanced Practice Midwife

## 2022-05-01 ENCOUNTER — Ambulatory Visit (INDEPENDENT_AMBULATORY_CARE_PROVIDER_SITE_OTHER): Payer: 59 | Admitting: Advanced Practice Midwife

## 2022-05-01 VITALS — BP 94/60 | Wt 166.0 lb

## 2022-05-01 DIAGNOSIS — Z3A30 30 weeks gestation of pregnancy: Secondary | ICD-10-CM

## 2022-05-01 DIAGNOSIS — Z3483 Encounter for supervision of other normal pregnancy, third trimester: Secondary | ICD-10-CM

## 2022-05-01 LAB — POCT URINALYSIS DIPSTICK OB
Glucose, UA: NEGATIVE
POC,PROTEIN,UA: NEGATIVE

## 2022-05-01 NOTE — Progress Notes (Signed)
ROB- no concerns 

## 2022-05-01 NOTE — Progress Notes (Signed)
Routine Prenatal Care Visit  Subjective  Kim Oliver is a 31 y.o. G1P0 at 30w1dbeing seen today for ongoing prenatal care.  She is currently monitored for the following issues for this low-risk pregnancy and has Overweight with body mass index (BMI) of 28 to 28.9 in adult; Supervision of other normal pregnancy, antepartum; and Gastroesophageal reflux disease without esophagitis on their problem list.  ----------------------------------------------------------------------------------- Interpreter present  Patient reports  hip pain .  Reviewed normal 28 wk labs and recommendation to continue PNV with Fe. Contractions: Not present. Vag. Bleeding: None.  Movement: Present. Leaking Fluid denies.  ----------------------------------------------------------------------------------- The following portions of the patient's history were reviewed and updated as appropriate: allergies, current medications, past family history, past medical history, past social history, past surgical history and problem list. Problem list updated.  Objective  Blood pressure 94/60, weight 166 lb (75.3 kg), last menstrual period 10/02/2021 Pregravid weight 145 lb (65.8 kg) Total Weight Gain 21 lb (9.526 kg) Urinalysis: Urine Protein    Urine Glucose    Fetal Status: Fetal Heart Rate (bpm): 137 Fundal Height: 32 cm Movement: Present     Normal growth scan 04/16/2022: 68%, 2 pounds 13 ounces, AFI 18.91  General:  Alert, oriented and cooperative. Patient is in no acute distress.  Skin: Skin is warm and dry. No rash noted.   Cardiovascular: Normal heart rate noted  Respiratory: Normal respiratory effort, no problems with respiration noted  Abdomen: Soft, gravid, appropriate for gestational age. Pain/Pressure: Absent     Pelvic:  Cervical exam deferred        Extremities: Normal range of motion.  Edema: None  Mental Status: Normal mood and affect. Normal behavior. Normal judgment and thought content.   Assessment   31 y.o. G1P0 at 381w1dy  07/09/2022, Alternate EDD Entry presenting for routine prenatal visit  Plan   pregnancy Problems (from 12/01/21 to present)     Problem Noted Resolved   Supervision of other normal pregnancy, antepartum 12/01/2021 by FrImagene RichesCNM No   Overview Addendum 04/18/2022  4:38 AM by FrImagene RichesCNM     Nursing Staff Provider  Office Location  [facility] dating, EDD per LMP 07/09/2022  Language  spanish Anatomy [place]  Flu Vaccine   Genetic Screen  NIPS: low risk female  TDaP vaccine    Hgb A1C or  GTT Early : Third trimester : 112  Covid    LAB RESULTS   Rhogam   Blood Type   O+  Feeding Plan  Antibody  nega  Contraception  Rubella  immune  Circumcision  RPR   NR  Pediatrician   HBsAg   neg  Support Person  HIV  negative  Prenatal Classes  Varicella immune    GBS  (For PCN allergy, check sensitivities)   BTL Consent     VBAC Consent  Pap  NILM    Hgb Electro      CF      SMA                   Preterm labor symptoms and general obstetric precautions including but not limited to vaginal bleeding, contractions, leaking of fluid and fetal movement were reviewed in detail with the patient. Please refer to After Visit Summary for other counseling recommendations.   Return in about 2 weeks (around 05/15/2022) for rob.  JaRod CanCNM 05/01/2022 2:47 PM

## 2022-05-01 NOTE — Addendum Note (Signed)
Addended by: Drenda Freeze on: 05/01/2022 03:09 PM   Modules accepted: Orders

## 2022-05-01 NOTE — Patient Instructions (Signed)
 Tercer trimestre de embarazo Third Trimester of Pregnancy  El tercer trimestre de embarazo va desde la semana 28 hasta la semana 40. Esto corresponde a los meses 7 a 9. El tercer trimestre es un perodo en el que el beb en gestacin (feto) crece rpidamente. Hacia el final del noveno mes, el feto mide alrededor de 20 pulgadas (45 cm) de largo y pesa entre 6 y 10 libras (2.7 y 4.5 kg). Cambios en el cuerpo durante el tercer trimestre Durante el tercer trimestre, su cuerpo contina experimentando numerosos cambios. Los cambios varan y generalmente vuelven a la normalidad despus del nacimiento del beb. Cambios fsicos Seguir aumentando de peso. Es de esperar que aumente entre 25 y 35 libras (11 y 16 kg) hacia el final del embarazo si inicia el embarazo con un peso normal. Si tiene bajo peso, es de esperar que aumente entre 28 y 40 libras (13 y 18 kg), y si tiene sobrepeso, es de esperar que aumente entre 15 y 25 libras (7 y 11 kg). Podrn aparecer las primeras estras en las caderas, el abdomen y las mamas. Las mamas seguirn creciendo y pueden doler. Un lquido amarillo (calostro) puede salir de sus pechos. Esta es la primera leche que usted produce para su beb. Tal vez haya cambios en el cabello. Esto cambios pueden incluir su engrosamiento, crecimiento rpido y cambios en la textura. A algunas personas tambin se les cae el cabello durante o despus del embarazo, o tienen el cabello seco o fino. El ombligo puede salir hacia afuera. Puede observar que se le hinchan las manos, el rostro o los tobillos. Cambios en la salud Es posible que tenga acidez estomacal. Puede sufrir estreimiento. Puede desarrollar hemorroides. Puede desarrollar venas hinchadas y abultadas (venas varicosas) en las piernas. Puede presentar ms dolor en la pelvis, la espalda o los muslos. Esto se debe al aumento de peso y al aumento de las hormonas que relajan las articulaciones. Puede presentar un aumento del  hormigueo o entumecimiento en las manos, brazos y piernas. La piel de su abdomen tambin puede sentirse entumecida. Puede sentir que le falta el aire debido a que se expande el tero. Otros cambios Puede tener necesidad de orinar con ms frecuencia porque el feto baja hacia la pelvis y ejerce presin sobre la vejiga. Puede tener ms problemas para dormir. Esto puede deberse al tamao de su abdomen, una mayor necesidad de orinar y un aumento en el metabolismo de su cuerpo. Puede notar que el feto "baja" o lo siente ms bajo, en el abdomen (aligeramiento). Puede tener un aumento de la secrecin vaginal. Puede notar que tiene dolor alrededor del hueso plvico a medida que el tero se distiende. Siga estas instrucciones en su casa: Medicamentos Siga las instrucciones del mdico en relacin con el uso de medicamentos. Durante el embarazo, hay medicamentos que pueden tomarse y otros que no. No tome ningn medicamento a menos que lo haya autorizado el mdico. Tome vitaminas prenatales que contengan por lo menos 600 microgramos (mcg) de cido flico. Comida y bebida Lleve una dieta saludable que incluya frutas y verduras frescas, cereales integrales, buenas fuentes de protenas como carnes magras, huevos o tofu, y productos lcteos descremados. Evite la carne cruda y el jugo, la leche y el queso sin pasteurizar. Estos portan grmenes que pueden provocar dao tanto a usted como al beb. Tome 4 o 5 comidas pequeas en lugar de 3 comidas abundantes al da. Es posible que tenga que tomar estas medidas para prevenir o tratar   el estreimiento: Beber suficiente lquido como para mantener la orina de color amarillo plido. Consumir alimentos ricos en fibra, como frijoles, cereales integrales, y frutas y verduras frescas. Limitar el consumo de alimentos ricos en grasa y azcares procesados, como los alimentos fritos o dulces. Actividad Haga ejercicio solamente como se lo haya indicado el mdico. La mayora de  las personas pueden continuar su actividad fsica habitual durante el embarazo. Intente realizar como mnimo 30 minutos de actividad fsica por lo menos 5 das a la semana. Deje de hacer ejercicio si experimenta contracciones en el tero. Deje de hacer ejercicio si le aparecen dolor o clicos en la parte baja del vientre o de la espalda. Evite levantar pesos excesivos. No haga ejercicio si hace mucho calor o humedad, o si se encuentra a una altitud elevada. Si lo desea, puede seguir teniendo relaciones sexuales, salvo que el mdico le indique lo contrario. Alivio del dolor y del malestar Haga pausas frecuentes y descanse con las piernas levantadas (elevadas) si tiene calambres en las piernas o dolor en la parte baja de la espalda. Dese baos de asiento con agua tibia para aliviar el dolor o las molestias causadas por las hemorroides. Use una crema para las hemorroides si el mdico la autoriza. Use un sujetador que le brinde buen soporte para prevenir las molestias causadas por la sensibilidad en las mamas. Si tiene venas varicosas: Use medias de compresin como se lo haya indicado el mdico. Eleve los pies durante 15 minutos, 3 o 4 veces por da. Limite el consumo de sal en su dieta. Seguridad Hable con su mdico antes de viajar distancias largas. No se d baos de inmersin en agua caliente, baos turcos ni saunas. Use el cinturn de seguridad en todo momento mientras conduce o va en auto. Hable con el mdico si es vctima de maltrato verbal o fsico. Preparacin para el nacimiento Para prepararse para la llegada de su beb: Tome clases prenatales para entender, practicar, y hacer preguntas sobre el trabajo de parto y el parto. Visite el hospital y recorra el rea de maternidad. Compre un asiento de seguridad orientado hacia atrs, y asegrese de saber cmo instalarlo en su automvil. Prepare la habitacin o el lugar donde dormir el beb. Asegrese de quitar todas las almohadas y animales de  peluche de la cuna del beb para evitar la asfixia. Indicaciones generales Evite el contacto con las bandejas sanitarias de los gatos y la tierra que estos animales usan. Estos alimentos contienen bacterias que pueden causar defectos congnitos en el beb. Si tiene un gato, pdale a alguien que limpie la caja de arena por usted. No se haga lavados vaginales ni use tampones. No use toallas higinicas perfumadas. No consuma ningn producto que contenga nicotina o tabaco, como cigarrillos, cigarrillos electrnicos y tabaco de mascar. Si necesita ayuda para dejar de consumir estos productos, consulte al mdico. No use ningn remedio a base de hierbas, drogas ilegales o medicamentos que no le hayan sido recetados. Las sustancias qumicas de estos productos pueden daar al beb. No beba alcohol. Le realizarn exmenes prenatales ms frecuentes durante el tercer trimestre. Durante una visita prenatal de rutina, el mdico le har un examen fsico, le realizar pruebas y hablar con usted de su salud general. Cumpla con todas las visitas de seguimiento. Esto es importante. Dnde buscar ms informacin American Pregnancy Association (Asociacin Estadounidense del Embarazo): americanpregnancy.org American College of Obstetricians and Gynecologists (Colegio Estadounidense de Obstetras y Gineclogos): acog.org/womens-health/pregnancy? Office on Women's Health (Oficina para la Salud de   la Mujer): womenshealth.gov/pregnancy Comunquese con un mdico si tiene: Fiebre. Clicos leves en la pelvis, presin en la pelvis o dolor persistente en la zona abdominal o la parte baja de la espalda. Vmitos o diarrea. Secrecin vaginal con mal olor u orina con mal olor. Dolor al orinar. Un dolor de cabeza que no desaparece despus de tomar analgsicos. Cambios en la visin o ve manchas delante de los ojos. Solicite ayuda de inmediato si: Rompe la bolsa. Tiene contracciones regulares separadas por menos de 5 minutos. Tiene  sangrado o pequeas prdidas vaginales. Siente un dolor abdominal intenso. Tiene dificultad para respirar. Siente dolor en el pecho. Sufre episodios de desmayo. No ha sentido a su beb moverse durante el perodo de tiempo que le indic el mdico. Tiene dolor, hinchazn o enrojecimiento nuevos en un brazo o una pierna o se produce un aumento de alguno de estos sntomas. Resumen El tercer trimestre del embarazo comprende desde la semana 28 hasta la semana 40 (desde el mes 7 hasta el mes 9). Puede tener ms problemas para dormir. Esto puede deberse al tamao de su abdomen, una mayor necesidad de orinar y un aumento en el metabolismo de su cuerpo. Le realizarn exmenes prenatales ms frecuentes durante el tercer trimestre. Cumpla con todas las visitas de seguimiento. Esto es importante. Esta informacin no tiene como fin reemplazar el consejo del mdico. Asegrese de hacerle al mdico cualquier pregunta que tenga. Document Revised: 04/26/2020 Document Reviewed: 04/26/2020 Elsevier Patient Education  2023 [employer].  

## 2022-05-12 ENCOUNTER — Other Ambulatory Visit: Payer: Self-pay

## 2022-05-12 DIAGNOSIS — D259 Leiomyoma of uterus, unspecified: Secondary | ICD-10-CM

## 2022-05-14 ENCOUNTER — Ambulatory Visit (INDEPENDENT_AMBULATORY_CARE_PROVIDER_SITE_OTHER): Payer: 59 | Admitting: Obstetrics

## 2022-05-14 ENCOUNTER — Ambulatory Visit: Payer: 59 | Attending: Maternal & Fetal Medicine

## 2022-05-14 ENCOUNTER — Other Ambulatory Visit: Payer: Self-pay

## 2022-05-14 VITALS — BP 111/69 | HR 70 | Temp 98.3°F | Ht <= 58 in | Wt 169.0 lb

## 2022-05-14 VITALS — BP 112/70 | Wt 168.0 lb

## 2022-05-14 DIAGNOSIS — D259 Leiomyoma of uterus, unspecified: Secondary | ICD-10-CM | POA: Diagnosis not present

## 2022-05-14 DIAGNOSIS — Z3A32 32 weeks gestation of pregnancy: Secondary | ICD-10-CM

## 2022-05-14 DIAGNOSIS — O3413 Maternal care for benign tumor of corpus uteri, third trimester: Secondary | ICD-10-CM | POA: Insufficient documentation

## 2022-05-14 DIAGNOSIS — Z3480 Encounter for supervision of other normal pregnancy, unspecified trimester: Secondary | ICD-10-CM

## 2022-05-14 DIAGNOSIS — Z3400 Encounter for supervision of normal first pregnancy, unspecified trimester: Secondary | ICD-10-CM

## 2022-05-14 NOTE — Progress Notes (Signed)
No vb. No lof.  

## 2022-05-14 NOTE — Progress Notes (Signed)
Routine Prenatal Care Visit  Subjective  Kim Oliver is a 31 y.o. G1P0 at 77w0dbeing seen today for ongoing prenatal care.  She is currently monitored for the following issues for this low-risk pregnancy and has Overweight with body mass index (BMI) of 28 to 28.9 in adult; Supervision of other normal pregnancy, antepartum; and Gastroesophageal reflux disease without esophagitis on their problem list.  ----------------------------------------------------------------------------------- Patient reports no complaints.  She just had an ultrasound, and her fibroid was visualized. Contractions: Not present. Vag. Bleeding: None.  Movement: Present. Leaking Fluid denies.  ----------------------------------------------------------------------------------- The following portions of the patient's history were reviewed and updated as appropriate: allergies, current medications, past family history, past medical history, past social history, past surgical history and problem list. Problem list updated.  Objective  Blood pressure 112/70, weight 168 lb (76.2 kg), last menstrual period 10/02/2021, unknown if currently breastfeeding. Pregravid weight 145 lb (65.8 kg) Total Weight Gain 23 lb (10.4 kg) Urinalysis: Urine Protein    Urine Glucose    Fetal Status: Fetal Heart Rate (bpm): 140s Fundal Height: 32 cm Movement: Present     General:  Alert, oriented and cooperative. Patient is in no acute distress.  Skin: Skin is warm and dry. No rash noted.   Cardiovascular: Normal heart rate noted  Respiratory: Normal respiratory effort, no problems with respiration noted  Abdomen: Soft, gravid, appropriate for gestational age. Pain/Pressure: Absent     Pelvic:  Cervical exam deferred        Extremities: Normal range of motion.  Edema: None  Mental Status: Normal mood and affect. Normal behavior. Normal judgment and thought content.   Assessment   31y.o. G1P0 at 366w0dy  07/09/2022, Alternate EDD Entry  presenting for routine prenatal visit  Plan   pregnancy Problems (from 12/01/21 to present)    Problem Noted Resolved   Supervision of other normal pregnancy, antepartum 12/01/2021 by FrImagene RichesCNM No   Overview Addendum 04/18/2022  4:38 AM by FrImagene RichesCNM     Nursing Staff Provider  Office Location  [facility], EDD per LMP 07/09/2022  Language  spanish Anatomy [place]  Flu Vaccine   Genetic Screen  NIPS: low risk female  TDaP vaccine    Hgb A1C or  GTT Early : Third trimester : 112  Covid    LAB RESULTS   Rhogam   Blood Type   O+  Feeding Plan  Antibody  nega  Contraception  Rubella  immune  Circumcision  RPR   NR  Pediatrician   HBsAg   neg  Support Person  HIV  negative  Prenatal Classes  Varicella immune    GBS  (For PCN allergy, check sensitivities)   BTL Consent     VBAC Consent  Pap  NILM    Hgb Electro      CF      SMA                  Preterm labor symptoms and general obstetric precautions including but not limited to vaginal bleeding, contractions, leaking of fluid and fetal movement were reviewed in detail with the patient. Please refer to After Visit Summary for other counseling recommendations.  Today she and her husband asked about circumcision- discussed the hx of its practice in the [place] cons and advantages.  Return in about 2 weeks (around 05/28/2022) for return OB.  MaImagene RichesCNM  05/14/2022 3:55 PM

## 2022-05-27 ENCOUNTER — Ambulatory Visit (INDEPENDENT_AMBULATORY_CARE_PROVIDER_SITE_OTHER): Payer: 59 | Admitting: Advanced Practice Midwife

## 2022-05-27 ENCOUNTER — Encounter: Payer: Self-pay | Admitting: Advanced Practice Midwife

## 2022-05-27 VITALS — BP 120/80 | Wt 171.0 lb

## 2022-05-27 DIAGNOSIS — Z3A33 33 weeks gestation of pregnancy: Secondary | ICD-10-CM

## 2022-05-27 DIAGNOSIS — Z3403 Encounter for supervision of normal first pregnancy, third trimester: Secondary | ICD-10-CM

## 2022-05-27 LAB — POCT URINALYSIS DIPSTICK OB
Glucose, UA: NEGATIVE
POC,PROTEIN,UA: NEGATIVE

## 2022-05-27 NOTE — Patient Instructions (Signed)
Signos y sntomas del trabajo de parto Signs and Symptoms of Labor El [place] de [place] es el proceso natural del cuerpo para sacar al beb y a la placenta del tero. Por lo general, el proceso del trabajo de parto comienza cuando el embarazo ha llegado a su trmino, entre las semanas 42 y 20 del Media planner. Signos y sntomas de que est cerca de empezar el [place] de parto A medida que el cuerpo se prepara para el [place] de parto y el nacimiento del beb, puede notar los siguientes sntomas en las semanas y [place] anteriores al trabajo de parto propiamente dicho: Eliminacin de una pequea cantidad de mucosidad espesa y sanguinolenta por la vagina. A esto se lo llama aparicin normal de sangre o prdida del tapn mucoso. Esto puede suceder ms de una semana antes de que comience el [place] de [place], o justo antes de que comience el [place] de parto a medida que el cuello uterino comienza a ensancharse (dilatarse). En algunas mujeres, el tapn Walt Disney entero de una sola vez. En otras, pueden salir partes del tapn mucoso de forma gradual United Stationers. El beb se mueve (desciende) a la parte inferior de la pelvis para ponerse en posicin para el nacimiento (aligeramiento). Cuando esto sucede, puede sentir ms presin en la vejiga y el hueso plvico, y menos presin en las costillas. Esto facilitar la respiracin. Tambin puede hacer que necesite orinar con ms frecuencia y que tenga problemas para Public house manager. Tener "contracciones de prctica", tambin llamadas contracciones de [place], o [place] de [place]. Estas se producen a intervalos irregulares (espaciadas de modo desigual) con una diferencia de ms de 10 minutos. Las contracciones de [place] de parto falso son frecuentes despus del ejercicio o la actividad sexual. Se detendrn si cambia de posicin, descansa o bebe lquidos. Estas contracciones son generalmente leves y no se tornan ms fuertes con el tiempo. Pueden sentirse  como lo siguiente: Un dolor de espalda. Calambres leves, similares a los [employer]Ex. Tirantez o presin en el abdomen. Otros sntomas tempranos pueden ser los siguientes: Nuseas o prdida del apetito. Diarrea. Una repentina explosin de energa o sentirse muy cansada. Cambios en el humor. Problemas para dormir. Signos y sntomas de que ha comenzado el trabajo de parto [place] signos de que est en trabajo de parto pueden incluir los siguientes: Contracciones a intervalos regulares (espaciadas de modo regular) que se incrementan en intensidad. Esto puede sentirse como presin o estrechamiento intenso en el abdomen, que se desplaza hacia la espalda. Las contracciones pueden sentirse tambin como dolor rtmico en la parte superior de los muslos y la espalda que va y viene a intervalos regulares. Si es la primera vez que da a Actuary, este cambio en la intensidad de las contracciones ocurre generalmente a un ritmo ms gradual. Si ya ha dado a luz antes, puede notar una progresin ms rpida de los cambios de las contracciones. Sensacin de presin en el rea vaginal. Ruptura de la bolsa (ruptura de las [place]). Se produce cuando el saco de lquido que rodea al beb se rompe. La prdida de lquido de la vagina puede ser transparente o estar teida de sangre. Generalmente el trabajo de parto comienza 24 horas despus de la ruptura de [place], [place] puede tomar ms Oceanographer. Algunas personas pueden sentir un chorro repentino de lquido; otras pueden observar ropa interior hmeda de forma repetida. Siga estas instrucciones en su casa:  Cuando comience el trabajo de parto o si rompe bolsa, llame  al mdico o a la lnea de atencin de enfermera. Ellos determinarn, en funcin de su situacin, cundo debe ir a Geophysical data processor. Durante el trabajo de parto temprano, es posible que pueda descansar y [employer] sntomas en su casa. Algunas estrategias para probar en su casa incluyen: Tcnicas  de respiracin y relajacin. Tomar una ducha o un bao de inmersin tibios. Escuchar msica. Usar una almohadilla trmica en la espalda para Best boy. Si se lo indican, aplique calor en la zona con la frecuencia que le haya indicado el mdico. Use la fuente de calor que el mdico le recomiende, como una compresa de calor hmedo o una almohadilla trmica. Coloque una toalla entre la piel y la fuente de Freight forwarder. Aplique calor durante 20 a 30 minutos. Retire la fuente de calor si la piel se pone de color rojo brillante. Esto es especialmente importante si no puede sentir dolor, calor o fro. Corre un mayor riesgo de sufrir quemaduras. Comunquese con un mdico si: Comenz el trabajo de [place]. Rompe la bolsa. Tiene nuseas, vmitos o diarrea. Solicite ayuda de inmediato si: Tiene contracciones dolorosas y regulares cada 5 minutos o menos. El trabajo de parto comienza antes de que se cumplan las 19 semanas de [place]. Tiene fiebre. Elimina cogulos de sangre de color rojo brillante por la vagina. No siente que el beb se mueva. Tiene dolor de cabeza intenso con o sin problemas de visin. Siente falta de aire o Tourist information centre manager. Estos sntomas pueden representar un problema grave que constituye Engineer, maintenance (IT). No espere a ver si los sntomas desaparecen. Solicite atencin mdica de inmediato. Comunquese con el servicio de emergencias de su localidad (911 en los [place]). No conduzca por sus propios medios Principal Financial. Resumen El trabajo de parto es el proceso natural del cuerpo por el cual se saca al beb y la placenta del tero. Por lo general, el proceso del trabajo de parto comienza cuando el embarazo ha llegado a su trmino, entre las semanas 59 y 58 de Media planner. Cuando comience el trabajo de parto o si rompe bolsa, llame al mdico o a la lnea de atencin de enfermera. Ellos determinarn, en funcin de su situacin, cundo debe ir a Geophysical data processor. Esta informacin no  tiene Marine scientist el consejo del mdico. Asegrese de hacerle al mdico cualquier pregunta que tenga. Document Revised: 03/25/2021 Document Reviewed: 03/25/2021 Elsevier Patient Education  Edroy de deteccin de estreptococos del grupo B durante el embarazo Group B Streptococcus Test During Pregnancy Por qu me debo realizar esta prueba? Se recomienda realizar pruebas de rutina, tambin llamadas pruebas de deteccin, para estreptococos del grupo B (EGB) entre la semana 24 y 66 de Media planner. Los EGB pertenecen a un tipo de bacteria que puede transmitirse de la madre al beb durante el parto. Las pruebas de Psychiatrist a Teacher, adult education si usted Designer, industrial/product o no tratamiento durante el [place] de parto y [place] para evitar complicaciones como: Una infeccin en el tero durante el [place] de [place]. Una infeccin en el tero despus del parto. Una infeccin grave en el beb despus del parto, como neumona, meningitis o sepsis. En general, la prueba de deteccin de EGB no se realiza antes de las 36 semanas de embarazo, a menos que comience el trabajo de parto prematuramente. Qu sucede si tengo estreptococos del grupo B? Si las pruebas muestran que tiene EGB, Network engineer tratamiento con antibiticos intravenosos durante el [place] de [place]  y [place]. Este tratamiento disminuye significativamente el riesgo de complicaciones para usted y el beb. Si tiene una cesrea planificada y tiene EGB, es posible que no necesite tratamiento con antibiticos porque los EGB se transmiten a los bebs generalmente despus de que comienza el [place] de parto y se rompe la bolsa de [place]. Si est en trabajo de parto o rompe la bolsa de aguas antes de la cesrea, es posible que los EGB se introduzcan en el tero y pasen al beb; en tal caso podra necesitar tratamiento. Existe la posibilidad de que no necesite hacerme pruebas? No es necesario que se le realice una prueba de  deteccin de EGB si: Tiene un anlisis de orina que [place] EGB antes de la semana 36 a 31. Tuvo un beb con infeccin por EGB despus de un parto anterior. En Omnicare, se la tratar automticamente para los EGB durante el [place] de parto y [place]. Qu se analiza? Esta prueba se realiza para verificar si tiene estreptococos del grupo B en la vagina o el recto. Qu tipo de [place] se toma? Para obtener muestras para esta prueba, Building surveyor un exudado vaginal y rectal con un hisopo de algodn. Luego, la muestra se enva al laboratorio para determinar si hay EGB. Qu ocurre durante la prueba?  Usted se Dance movement psychotherapist ropa de la cintura [place]. Deber acostarse en una camilla en la misma posicin que lo hara para un examen plvico. El Special educational needs teacher un exudado vaginal y rectal con un hisopo de algodn para una prueba de cultivo. Podr irse a casa y Optometrist sus actividades habituales inmediatamente despus de los estudios. Cmo se informan los resultados? Los [employer] de la prueba se informan como positivos o negativos. [place] significan los resultados? Una prueba positiva significa que usted tiene riesgo de State [employer] Corporation EGB al beb durante el [place] de parto y [place]. El Gaffer tratamiento con antibiticos intravenosos durante el [place] de parto y [place]. Una prueba con resultado negativo significa que tiene un riesgo muy bajo de transmitirle los EGB al beb. An existe un riesgo bajo de transmitir los EGB al beb porque a veces, los [employer] Industries de la prueba pueden informar que usted no tiene una afeccin cuando realmente la tiene (resultado falso-negativo) o existe la posibilidad de que pueda infectarse con los EGB despus de la realizacin de la prueba. Es muy probable que no necesite tratamiento con un antibitico durante el [place] de parto y [place]. Hable con su mdico sobre lo que significan sus [place]. Preguntas para hacerle al  mdico Consulte a su mdico o pregunte en el departamento donde se realiza la prueba acerca de lo siguiente: Cundo estarn disponibles mis resultados? Cmo obtendr mis resultados? Cules son las opciones de tratamiento? Resumen Se recomienda a todas las mujeres embarazadas la realizacin de pruebas de rutina (pruebas de Programme researcher, broadcasting/film/video) para la deteccin de estreptococos del grupo B (EGB) entre la semana 38 y 59 de Media planner. Los EGB pertenecen a un tipo de bacteria que puede transmitirse de la madre al beb durante el parto. Si las pruebas [employer] Parcel tiene EGB, Network engineer tratamiento con antibiticos intravenosos durante el [place] de parto y [place]. Este tratamiento casi siempre evita la infeccin en los recin nacidos. Esta informacin no tiene Marine scientist el consejo del mdico. Asegrese de hacerle al mdico cualquier pregunta que tenga. Document Revised: 12/21/2018 Document Reviewed: 12/21/2018 Elsevier Patient Education  [place].

## 2022-05-27 NOTE — Progress Notes (Signed)
Routine Prenatal Care Visit  Subjective  Kim Oliver is a 31 y.o. G1P0 at 24w6dbeing seen today for ongoing prenatal care.  She is currently monitored for the following issues for this low-risk pregnancy and has Overweight with body mass index (BMI) of 28 to 28.9 in adult; Supervision of normal pregnancy; and Gastroesophageal reflux disease without esophagitis on their problem list.  -----------------------------------------------------------------------------------  Interpreter present  Patient reports no complaints.  Reviewed GBS next visit, labor precautions. Contractions: Not present. Vag. Bleeding: None.  Movement: Present. Leaking Fluid denies.  ----------------------------------------------------------------------------------- The following portions of the patient's history were reviewed and updated as appropriate: allergies, current medications, past family history, past medical history, past social history, past surgical history and problem list. Problem list updated.  Objective  Blood pressure 120/80, weight 171 lb (77.6 kg), last menstrual period 10/02/2021 Pregravid weight 145 lb (65.8 kg) Total Weight Gain 26 lb (11.8 kg) Urinalysis: Urine Protein    Urine Glucose    Fetal Status: Fetal Heart Rate (bpm): 138 Fundal Height: 34 cm Movement: Present     General:  Alert, oriented and cooperative. Patient is in no acute distress.  Skin: Skin is warm and dry. No rash noted.   Cardiovascular: Normal heart rate noted  Respiratory: Normal respiratory effort, no problems with respiration noted  Abdomen: Soft, gravid, appropriate for gestational age. Pain/Pressure: Absent     Pelvic:  Cervical exam deferred        Extremities: Normal range of motion.  Edema: None  Mental Status: Normal mood and affect. Normal behavior. Normal judgment and thought content.   Assessment   31y.o. G1P0 at 368w6dy  07/09/2022, Alternate EDD Entry presenting for routine prenatal visit  Plan    pregnancy Problems (from 12/01/21 to present)    Problem Noted Resolved   Supervision of normal pregnancy 12/01/2021 by FrImagene RichesCNM No   Overview Addendum 04/18/2022  4:38 AM by FrImagene RichesCNM     Nursing Staff Provider  Office Location  [place], EDD per LMP 07/09/2022  Language  spanish Anatomy [place]  Flu Vaccine   Genetic Screen  NIPS: low risk female  TDaP vaccine    Hgb A1C or  GTT Early : Third trimester : 112  Covid    LAB RESULTS   Rhogam   Blood Type   O+  Feeding Plan  Antibody  nega  Contraception  Rubella  immune  Circumcision  RPR   NR  Pediatrician   HBsAg   neg  Support Person  HIV  negative  Prenatal Classes  Varicella immune    GBS  (For PCN allergy, check sensitivities)   BTL Consent     VBAC Consent  Pap  NILM    Hgb Electro      CF      SMA                  Preterm labor symptoms and general obstetric precautions including but not limited to vaginal bleeding, contractions, leaking of fluid and fetal movement were reviewed in detail with the patient. Please refer to After Visit Summary for other counseling recommendations.   Return in about 15 days (around 06/11/2022) for rob.  JaRod CanCNM 05/27/2022 3:34 PM

## 2022-05-27 NOTE — Addendum Note (Signed)
Addended by: Quintella Baton D on: 05/27/2022 03:40 PM   Modules accepted: Orders

## 2022-06-03 ENCOUNTER — Telehealth: Payer: Self-pay

## 2022-06-03 NOTE — Telephone Encounter (Signed)
FMLA/DISABILITY form for [employer] filled out.  Will get signature and give to Bev for processing.

## 2022-06-09 ENCOUNTER — Other Ambulatory Visit: Payer: Self-pay

## 2022-06-09 DIAGNOSIS — D259 Leiomyoma of uterus, unspecified: Secondary | ICD-10-CM

## 2022-06-09 DIAGNOSIS — K219 Gastro-esophageal reflux disease without esophagitis: Secondary | ICD-10-CM

## 2022-06-11 ENCOUNTER — Other Ambulatory Visit (HOSPITAL_COMMUNITY)
Admission: RE | Admit: 2022-06-11 | Discharge: 2022-06-11 | Disposition: A | Discharge status: Home / Self Care | Payer: 59 | Source: Ambulatory Visit | Attending: Licensed Practical Nurse | Admitting: Licensed Practical Nurse

## 2022-06-11 ENCOUNTER — Ambulatory Visit: Payer: 59 | Attending: Maternal & Fetal Medicine

## 2022-06-11 ENCOUNTER — Other Ambulatory Visit: Payer: Self-pay

## 2022-06-11 ENCOUNTER — Ambulatory Visit (INDEPENDENT_AMBULATORY_CARE_PROVIDER_SITE_OTHER): Payer: 59 | Admitting: Licensed Practical Nurse

## 2022-06-11 VITALS — BP 120/70 | Wt 174.0 lb

## 2022-06-11 DIAGNOSIS — O0990 Supervision of high risk pregnancy, unspecified, unspecified trimester: Secondary | ICD-10-CM | POA: Diagnosis present

## 2022-06-11 DIAGNOSIS — Z3685 Encounter for antenatal screening for Streptococcus B: Secondary | ICD-10-CM

## 2022-06-11 DIAGNOSIS — Z113 Encounter for screening for infections with a predominantly sexual mode of transmission: Secondary | ICD-10-CM | POA: Insufficient documentation

## 2022-06-11 DIAGNOSIS — O3413 Maternal care for benign tumor of corpus uteri, third trimester: Secondary | ICD-10-CM | POA: Diagnosis not present

## 2022-06-11 DIAGNOSIS — Z3A36 36 weeks gestation of pregnancy: Secondary | ICD-10-CM

## 2022-06-11 DIAGNOSIS — D259 Leiomyoma of uterus, unspecified: Secondary | ICD-10-CM

## 2022-06-11 DIAGNOSIS — Z3400 Encounter for supervision of normal first pregnancy, unspecified trimester: Secondary | ICD-10-CM

## 2022-06-11 DIAGNOSIS — K219 Gastro-esophageal reflux disease without esophagitis: Secondary | ICD-10-CM

## 2022-06-11 DIAGNOSIS — O99613 Diseases of the digestive system complicating pregnancy, third trimester: Secondary | ICD-10-CM | POA: Insufficient documentation

## 2022-06-11 NOTE — Progress Notes (Signed)
Routine Prenatal Care Visit  Subjective  Kim Oliver is a 31 y.o. G1P0 at 72w0dbeing seen today for ongoing prenatal care.  She is currently monitored for the following issues for this high-risk pregnancy and has Overweight with body mass index (BMI) of 28 to 28.9 in adult; Supervision of normal pregnancy; and Gastroesophageal reflux disease without esophagitis on their problem list.  ----------------------------------------------------------------------------------- Patient reports no complaints.  Here mother and Spanish interpretor. Has occasional tightening in the abd.  Finished CBE.  Will have mother and husband around once the baby is born. Has apt with MFM today, hopefully will determine mode of birth after today's visit.  Contractions: Not present. Vag. Bleeding: None.  Movement: Present. Leaking Fluid denies.  ----------------------------------------------------------------------------------- The following portions of the patient's history were reviewed and updated as appropriate: allergies, current medications, past family history, past medical history, past social history, past surgical history and problem list. Problem list updated.  Objective  Blood pressure 120/70, weight 174 lb (78.9 kg), last menstrual period 10/02/2021, unknown if currently breastfeeding. Pregravid weight 145 lb (65.8 kg) Total Weight Gain 29 lb (13.2 kg) Urinalysis: Urine Protein    Urine Glucose    Fetal Status: Fetal Heart Rate (bpm): 125   Movement: Present     General:  Alert, oriented and cooperative. Patient is in no acute distress.  Skin: Skin is warm and dry. No rash noted.   Cardiovascular: Normal heart rate noted  Respiratory: Normal respiratory effort, no problems with respiration noted  Abdomen: Soft, gravid, appropriate for gestational age. Pain/Pressure: Absent     Pelvic:  Cervical exam deferred Dilation: Fingertip Effacement (%): Thick Station: Ballotable  Extremities: Normal range of  motion.  Edema: None  Mental Status: Normal mood and affect. Normal behavior. Normal judgment and thought content.   Assessment   31y.o. G1P0 at 353w0dy  07/09/2022, Alternate EDD Entry presenting for routine prenatal visit  Plan   pregnancy Problems (from 12/01/21 to present)     Problem Noted Resolved   Supervision of normal pregnancy 12/01/2021 by FrImagene RichesCNM No   Overview Addendum 04/18/2022  4:38 AM by FrImagene RichesCNM     Nursing Staff Provider  Office Location  [facility] [place], EDD per LMP 07/09/2022  Language  spanish Anatomy [place]  Flu Vaccine   Genetic Screen  NIPS: low risk female  TDaP vaccine    Hgb A1C or  GTT Early : Third trimester : 112  Covid    LAB RESULTS   Rhogam   Blood Type   O+  Feeding Plan  Antibody  nega  Contraception  Rubella  immune  Circumcision  RPR   NR  Pediatrician   HBsAg   neg  Support Person  HIV  negative  Prenatal Classes  Varicella immune    GBS  (For PCN allergy, check sensitivities)   BTL Consent     VBAC Consent  Pap  NILM    Hgb Electro      CF      SMA                   Preterm labor symptoms and general obstetric precautions including but not limited to vaginal bleeding, contractions, leaking of fluid and fetal movement were reviewed in detail with the patient. Please refer to After Visit Summary for other counseling recommendations.   Return in about 1 week (around 06/18/2022) for [place] 36 wk labs collected  Roberto Scales, CNM  [place] [place], [facility]  06/11/22  2:40 PM

## 2022-06-15 LAB — CERVICOVAGINAL ANCILLARY ONLY
Chlamydia: NEGATIVE
Comment: NEGATIVE
Comment: NORMAL
Neisseria Gonorrhea: NEGATIVE

## 2022-06-15 LAB — CULTURE, BETA STREP (GROUP B ONLY): Strep Gp B Culture: NEGATIVE

## 2022-06-18 ENCOUNTER — Ambulatory Visit (INDEPENDENT_AMBULATORY_CARE_PROVIDER_SITE_OTHER): Payer: 59 | Admitting: Obstetrics & Gynecology

## 2022-06-18 VITALS — BP 112/68 | Wt 177.8 lb

## 2022-06-18 DIAGNOSIS — Z3400 Encounter for supervision of normal first pregnancy, unspecified trimester: Secondary | ICD-10-CM

## 2022-06-23 ENCOUNTER — Ambulatory Visit (INDEPENDENT_AMBULATORY_CARE_PROVIDER_SITE_OTHER): Payer: 59 | Admitting: Advanced Practice Midwife

## 2022-06-23 ENCOUNTER — Encounter: Payer: Self-pay | Admitting: Advanced Practice Midwife

## 2022-06-23 VITALS — BP 120/80 | Wt 179.0 lb

## 2022-06-23 DIAGNOSIS — Z3403 Encounter for supervision of normal first pregnancy, third trimester: Secondary | ICD-10-CM

## 2022-06-23 DIAGNOSIS — Z3A37 37 weeks gestation of pregnancy: Secondary | ICD-10-CM

## 2022-06-23 LAB — POCT URINALYSIS DIPSTICK OB
Glucose, UA: NEGATIVE
POC,PROTEIN,UA: NEGATIVE

## 2022-06-23 NOTE — Progress Notes (Signed)
Routine Prenatal Care Visit  Subjective  Kim Oliver is a 31 y.o. G1P0 at 18w5dbeing seen today for ongoing prenatal care.  She is currently monitored for the following issues for this low-risk pregnancy and has Overweight with body mass index (BMI) of 28 to 28.9 in adult; Supervision of normal pregnancy; and Gastroesophageal reflux disease without esophagitis on their problem list.  ----------------------------------------------------------------------------------- Patient reports some pelvic pain. Comfort measures reviewed. There was no mention of mode of delivery at last MFM visit. I will send a message today to see if there is any concern regarding vaginal delivery. Per Dr DHulan Fray it is most likely ok to plan for vaginal delivery since there is no comment regarding concern for location of fibroids in otherwise normal u/s.   Contractions: Irregular. Vag. Bleeding: None.  Movement: Present. Leaking Fluid denies.  ----------------------------------------------------------------------------------- The following portions of the patient's history were reviewed and updated as appropriate: allergies, current medications, past family history, past medical history, past social history, past surgical history and problem list. Problem list updated.  Objective  Blood pressure 120/80, weight 179 lb (81.2 kg), last menstrual period 10/02/2021 Pregravid weight 145 lb (65.8 kg) Total Weight Gain 34 lb (15.4 kg) Urinalysis: Urine Protein Negative  Urine Glucose Negative  Fetal Status: Fetal Heart Rate (bpm): 135 Fundal Height: 37 cm Movement: Present     General:  Alert, oriented and cooperative. Patient is in no acute distress.  Skin: Skin is warm and dry. No rash noted.   Cardiovascular: Normal heart rate noted  Respiratory: Normal respiratory effort, no problems with respiration noted  Abdomen: Soft, gravid, appropriate for gestational age. Pain/Pressure: Present     Pelvic:  Cervical exam deferred         Extremities: Normal range of motion.  Edema: None  Mental Status: Normal mood and affect. Normal behavior. Normal judgment and thought content.   Assessment   31y.o. G1P0 at 354w5dy  07/09/2022, Alternate EDD Entry presenting for routine prenatal visit  Plan   pregnancy Problems (from 12/01/21 to present)    Problem Noted Resolved   Supervision of normal pregnancy 12/01/2021 by FrImagene RichesCNM No   Overview Addendum 06/11/2022  2:41 PM by DoAllen DerryCNStuarttaff Provider  [place] 07/09/2022  Language  spanish Anatomy [place]roids   Flu Vaccine   Genetic Screen  NIPS: low risk female  TDaP vaccine   04/16/22 Hgb A1C or  GTT Early : Third trimester : 112  Covid    LAB RESULTS   Rhogam  NA Blood Type   O+  Feeding Plan breast Antibody  nega  Contraception  Rubella  immune  Circumcision  RPR   NR  Pediatrician   HBsAg   neg  Support Person Husband  HIV  negative  Prenatal Classes Online  Varicella immune    GBS  (For PCN allergy, check sensitivities)   BTL Consent     VBAC Consent  Pap  NILM    Hgb Electro      CF      SMA                  Term labor symptoms and general obstetric precautions including but not limited to vaginal bleeding, contractions, leaking of fluid and fetal movement were reviewed in detail with the patient. Please refer to After Visit Summary for other counseling recommendations.  Return in about 1 week (around 06/30/2022) for rob.  Rod Can, CNM 06/23/2022 12:22 PM

## 2022-07-02 ENCOUNTER — Ambulatory Visit (INDEPENDENT_AMBULATORY_CARE_PROVIDER_SITE_OTHER): Payer: 59 | Admitting: Obstetrics

## 2022-07-02 VITALS — BP 120/80 | Wt 181.0 lb

## 2022-07-02 DIAGNOSIS — Z3400 Encounter for supervision of normal first pregnancy, unspecified trimester: Secondary | ICD-10-CM

## 2022-07-02 DIAGNOSIS — Z3A39 39 weeks gestation of pregnancy: Secondary | ICD-10-CM

## 2022-07-02 NOTE — Progress Notes (Signed)
Routine Prenatal Care Visit  Subjective  Kim Oliver is a 31 y.o. G1P0 at 15w0dbeing seen today for ongoing prenatal care.  She is currently monitored for the following issues for this low-risk pregnancy and has Overweight with body mass index (BMI) of 28 to 28.9 in adult; Supervision of normal pregnancy; and Gastroesophageal reflux disease without esophagitis on their problem list.  ----------------------------------------------------------------------------------- Patient reports no complaints.  She would welcome labor. No signs of latent labor. She is walking a lot. Contractions: Irregular. Vag. Bleeding: None.  Movement: Present. Leaking Fluid denies.  ----------------------------------------------------------------------------------- The following portions of the patient's history were reviewed and updated as appropriate: allergies, current medications, past family history, past medical history, past social history, past surgical history and problem list. Problem list updated.  Objective  Blood pressure 120/80, weight 181 lb (82.1 kg), last menstrual period 10/02/2021, unknown if currently breastfeeding. Pregravid weight 145 lb (65.8 kg) Total Weight Gain 36 lb (16.3 kg) Urinalysis: Urine Protein    Urine Glucose    Fetal Status:     Movement: Present     General:  Alert, oriented and cooperative. Patient is in no acute distress.  Skin: Skin is warm and dry. No rash noted.   Cardiovascular: Normal heart rate noted  Respiratory: Normal respiratory effort, no problems with respiration noted  Abdomen: Soft, gravid, appropriate for gestational age. Pain/Pressure: Present     Pelvic:  cervix is closed/thick, and the baby's head was not reached. bedside scan revelas vertrex with slightly diagonal lie to the baby        Extremities: Normal range of motion.     Mental Status: Normal mood and affect. Normal behavior. Normal judgment and thought content.   Assessment   31y.o. G1P0 at  364w0dy  07/09/2022, Alternate EDD Entry presenting for routine prenatal visit  Plan   pregnancy Problems (from 12/01/21 to present)    Problem Noted Resolved   Supervision of normal pregnancy 12/01/2021 by FrImagene RichesCNM No   Overview Addendum 07/02/2022  3:04 PM by FrImagene RichesCNM     Nursing Staff Provider  [place] per LMP 07/09/2022  Language  spanish Anatomy [place]ibroids   Flu Vaccine   Genetic Screen  NIPS: low risk female  TDaP vaccine   04/16/22 Hgb A1C or  GTT Early : Third trimester : 112  Covid    LAB RESULTS   Rhogam  NA Blood Type   O+  Feeding Plan breast Antibody  nega  Contraception  Rubella  immune  Circumcision  RPR   NR  Pediatrician   HBsAg   neg  Support Person Husband  HIV  negative  Prenatal Classes Online  Varicella immune    GBS  (For PCN allergy, check sensitivities) negative  BTL Consent     VBAC Consent  Pap  NILM    Hgb Electro      CF      SMA                  Term labor symptoms and general obstetric precautions including but not limited to vaginal bleeding, contractions, leaking of fluid and fetal movement were reviewed in detail with the patient. Please refer to After Visit Summary for other counseling recommendations.  We discussed natural cervical ripeners. Confirmed vertex with sono.  EPO suggested and IC. She does have fibroids. Discussed IOL at 41 weeks. Needs another SVE next week.  No follow-ups on file.  Imagene Riches, CNM  07/02/2022 3:36 PM

## 2022-07-10 ENCOUNTER — Ambulatory Visit (INDEPENDENT_AMBULATORY_CARE_PROVIDER_SITE_OTHER): Payer: 59 | Admitting: Obstetrics

## 2022-07-10 VITALS — BP 122/74 | Wt 181.0 lb

## 2022-07-10 DIAGNOSIS — Z3A40 40 weeks gestation of pregnancy: Secondary | ICD-10-CM

## 2022-07-10 DIAGNOSIS — Z3400 Encounter for supervision of normal first pregnancy, unspecified trimester: Secondary | ICD-10-CM

## 2022-07-10 NOTE — Progress Notes (Signed)
Routine Prenatal Care Visit  Subjective  Kim Oliver is a 31 y.o. G1P0 at 58w1dbeing seen today for ongoing prenatal care.  She is currently monitored for the following issues for this low-risk pregnancy and has Overweight with body mass index (BMI) of 28 to 28.9 in adult; Supervision of normal pregnancy; and Gastroesophageal reflux disease without esophagitis on their problem list.  ----------------------------------------------------------------------------------- Patient reports no complaints.  She has been trying natural cervical ripeners. No contractions. Her baby is active. Contractions: Not present. Vag. Bleeding: None.  Movement: Present. Leaking Fluid denies.  ----------------------------------------------------------------------------------- The following portions of the patient's history were reviewed and updated as appropriate: allergies, current medications, past family history, past medical history, past social history, past surgical history and problem list. Problem list updated.  Objective  Blood pressure 122/74, weight 181 lb (82.1 kg), last menstrual period 10/02/2021, unknown if currently breastfeeding. Pregravid weight 145 lb (65.8 kg) Total Weight Gain 36 lb (16.3 kg) Urinalysis: Urine Protein    Urine Glucose    Fetal Status:     Movement: Present     General:  Alert, oriented and cooperative. Patient is in no acute distress.  Skin: Skin is warm and dry. No rash noted.   Cardiovascular: Normal heart rate noted  Respiratory: Normal respiratory effort, no problems with respiration noted  Abdomen: Soft, gravid, appropriate for gestational age. Pain/Pressure: Present     Pelvic:  closed/50%/ballotable        Extremities: Normal range of motion.     Mental Status: Normal mood and affect. Normal behavior. Normal judgment and thought content.   Assessment   31y.o. G1P0 at 458w1dy  07/09/2022, Alternate EDD Entry presenting for routine prenatal visit  Plan    pregnancy Problems (from 12/01/21 to present)    Problem Noted Resolved   Supervision of normal pregnancy 12/01/2021 by FrImagene RichesCNM No   Overview Addendum 07/02/2022  3:04 PM by FrImagene RichesCNM     Nursing Staff Provider  [place] 07/09/2022  Language  spanish Anatomy [place]ibroids   Flu Vaccine   Genetic Screen  NIPS: low risk female  TDaP vaccine   04/16/22 Hgb A1C or  GTT Early : Third trimester : 112  Covid    LAB RESULTS   Rhogam  NA Blood Type   O+  Feeding Plan breast Antibody  nega  Contraception  Rubella  immune  Circumcision  RPR   NR  Pediatrician   HBsAg   neg  Support Person Husband  HIV  negative  Prenatal Classes Online  Varicella immune    GBS  (For PCN allergy, check sensitivities) negative  BTL Consent     VBAC Consent  Pap  NILM    Hgb Electro      CF      SMA                  Term labor symptoms and general obstetric precautions including but not limited to vaginal bleeding, contractions, leaking of fluid and fetal movement were reviewed in detail with the patient. Please refer to After Visit Summary for other counseling recommendations.  Discussed IOL at 41 weeks. The baby is still quite high in the pelvis. IOL set for next Thursday, 9/14 at 0001.  Return in about 3 days (around 07/13/2022) for return OB, either Monday or Tuesday if possible. discuss IOL option if not delivered.. Kim Oliver  Kim Oliver, CNM  07/10/2022 10:15 AM

## 2022-07-10 NOTE — Progress Notes (Signed)
Cervical check today.

## 2022-07-13 ENCOUNTER — Ambulatory Visit (INDEPENDENT_AMBULATORY_CARE_PROVIDER_SITE_OTHER): Payer: 59 | Admitting: Obstetrics & Gynecology

## 2022-07-13 VITALS — BP 102/60 | Wt 182.4 lb

## 2022-07-13 DIAGNOSIS — Z3A40 40 weeks gestation of pregnancy: Secondary | ICD-10-CM

## 2022-07-14 ENCOUNTER — Encounter: Payer: Self-pay | Admitting: Obstetrics

## 2022-07-16 ENCOUNTER — Inpatient Hospital Stay: Payer: 59 | Admitting: General Practice

## 2022-07-16 ENCOUNTER — Inpatient Hospital Stay
Admission: EM | Admit: 2022-07-16 | Discharge: 2022-07-18 | DRG: 806 | Disposition: A | Discharge status: Home / Self Care | Payer: 59 | Attending: Certified Nurse Midwife | Admitting: Certified Nurse Midwife

## 2022-07-16 ENCOUNTER — Other Ambulatory Visit: Payer: Self-pay

## 2022-07-16 ENCOUNTER — Encounter: Payer: Self-pay | Admitting: Obstetrics and Gynecology

## 2022-07-16 DIAGNOSIS — O480 Post-term pregnancy: Secondary | ICD-10-CM | POA: Diagnosis present

## 2022-07-16 DIAGNOSIS — Z3A41 41 weeks gestation of pregnancy: Secondary | ICD-10-CM | POA: Diagnosis not present

## 2022-07-16 DIAGNOSIS — O9081 Anemia of the puerperium: Secondary | ICD-10-CM | POA: Diagnosis not present

## 2022-07-16 DIAGNOSIS — D62 Acute posthemorrhagic anemia: Secondary | ICD-10-CM | POA: Diagnosis not present

## 2022-07-16 DIAGNOSIS — Z3490 Encounter for supervision of normal pregnancy, unspecified, unspecified trimester: Secondary | ICD-10-CM | POA: Diagnosis present

## 2022-07-16 LAB — ABO/RH: ABO/RH(D): O POS

## 2022-07-16 LAB — CBC
HCT: 38.1 % (ref 36.0–46.0)
Hemoglobin: 13.5 g/dL (ref 12.0–15.0)
MCH: 34.9 pg — ABNORMAL HIGH (ref 26.0–34.0)
MCHC: 35.4 g/dL (ref 30.0–36.0)
MCV: 98.4 fL (ref 80.0–100.0)
Platelets: 179 10*3/uL (ref 150–400)
RBC: 3.87 MIL/uL (ref 3.87–5.11)
RDW: 13.6 % (ref 11.5–15.5)
WBC: 10.3 10*3/uL (ref 4.0–10.5)
nRBC: 0 % (ref 0.0–0.2)

## 2022-07-16 LAB — TYPE AND SCREEN
ABO/RH(D): O POS
Antibody Screen: NEGATIVE

## 2022-07-16 LAB — RPR: RPR Ser Ql: NONREACTIVE

## 2022-07-16 MED ORDER — MISOPROSTOL 25 MCG QUARTER TABLET
25.0000 ug | ORAL_TABLET | ORAL | Status: DC | PRN
  Administered 2022-07-16: 25 ug via VAGINAL
  Filled 2022-07-16: qty 1

## 2022-07-16 MED ORDER — DIPHENHYDRAMINE HCL 50 MG/ML IJ SOLN: 12.5000 mg | INTRAMUSCULAR | Status: DC | PRN

## 2022-07-16 MED ORDER — OXYTOCIN 10 UNIT/ML IJ SOLN
INTRAMUSCULAR | Status: AC
  Filled 2022-07-16: qty 2

## 2022-07-16 MED ORDER — BUPIVACAINE HCL (PF) 0.25 % IJ SOLN
INTRAMUSCULAR | Status: DC | PRN
  Administered 2022-07-16 (×2): 3 mL via EPIDURAL

## 2022-07-16 MED ORDER — PHENYLEPHRINE 80 MCG/ML (10ML) SYRINGE FOR IV PUSH (FOR BLOOD PRESSURE SUPPORT): 80.0000 ug | PREFILLED_SYRINGE | INTRAVENOUS | Status: DC | PRN

## 2022-07-16 MED ORDER — EPHEDRINE 5 MG/ML INJ: 10.0000 mg | INTRAVENOUS | Status: DC | PRN

## 2022-07-16 MED ORDER — OXYTOCIN-SODIUM CHLORIDE 30-0.9 UT/500ML-% IV SOLN
1.0000 m[IU]/min | INTRAVENOUS | Status: DC
  Filled 2022-07-16: qty 500

## 2022-07-16 MED ORDER — BENZOCAINE-MENTHOL 20-0.5 % EX AERO
1.0000 | INHALATION_SPRAY | CUTANEOUS | Status: DC | PRN
  Administered 2022-07-16: 1 via TOPICAL
  Filled 2022-07-16: qty 56

## 2022-07-16 MED ORDER — FERROUS SULFATE 325 (65 FE) MG PO TABS
325.0000 mg | ORAL_TABLET | Freq: Every day | ORAL | Status: DC
  Administered 2022-07-17 – 2022-07-18 (×2): 325 mg via ORAL
  Filled 2022-07-16 (×2): qty 1

## 2022-07-16 MED ORDER — LIDOCAINE HCL (PF) 1 % IJ SOLN: 30.0000 mL | INTRAMUSCULAR | Status: DC | PRN

## 2022-07-16 MED ORDER — COCONUT OIL OIL: 1.0000 | TOPICAL_OIL | Status: DC | PRN

## 2022-07-16 MED ORDER — SENNOSIDES-DOCUSATE SODIUM 8.6-50 MG PO TABS
2.0000 | ORAL_TABLET | ORAL | Status: DC
  Administered 2022-07-16 – 2022-07-17 (×2): 2 via ORAL
  Filled 2022-07-16 (×2): qty 2

## 2022-07-16 MED ORDER — METHYLERGONOVINE MALEATE 0.2 MG PO TABS: 0.2000 mg | ORAL_TABLET | ORAL | Status: DC | PRN

## 2022-07-16 MED ORDER — METHYLERGONOVINE MALEATE 0.2 MG/ML IJ SOLN: 0.2000 mg | INTRAMUSCULAR | Status: DC | PRN

## 2022-07-16 MED ORDER — LACTATED RINGERS IV SOLN: 500.0000 mL | INTRAVENOUS | Status: DC | PRN

## 2022-07-16 MED ORDER — MISOPROSTOL 25 MCG QUARTER TABLET
25.0000 ug | ORAL_TABLET | ORAL | Status: DC
  Administered 2022-07-16: 25 ug via ORAL
  Filled 2022-07-16: qty 1

## 2022-07-16 MED ORDER — FENTANYL-BUPIVACAINE-NACL 0.5-0.125-0.9 MG/250ML-% EP SOLN
12.0000 mL/h | EPIDURAL | Status: DC | PRN
  Administered 2022-07-16: 10 mL/h via EPIDURAL

## 2022-07-16 MED ORDER — TERBUTALINE SULFATE 1 MG/ML IJ SOLN: 0.2500 mg | Freq: Once | INTRAMUSCULAR | Status: DC | PRN

## 2022-07-16 MED ORDER — OXYCODONE-ACETAMINOPHEN 5-325 MG PO TABS: 1.0000 | ORAL_TABLET | ORAL | Status: DC | PRN

## 2022-07-16 MED ORDER — ONDANSETRON HCL 4 MG/2ML IJ SOLN: 4.0000 mg | INTRAMUSCULAR | Status: DC | PRN

## 2022-07-16 MED ORDER — WITCH HAZEL-GLYCERIN EX PADS
1.0000 | MEDICATED_PAD | CUTANEOUS | Status: DC | PRN
  Administered 2022-07-16: 1 via TOPICAL
  Filled 2022-07-16: qty 100

## 2022-07-16 MED ORDER — LACTATED RINGERS IV SOLN: INTRAVENOUS | Status: DC

## 2022-07-16 MED ORDER — MISOPROSTOL 25 MCG QUARTER TABLET
25.0000 ug | ORAL_TABLET | Freq: Once | ORAL | Status: AC
  Administered 2022-07-16: 25 ug via VAGINAL
  Filled 2022-07-16: qty 1

## 2022-07-16 MED ORDER — OXYTOCIN-SODIUM CHLORIDE 30-0.9 UT/500ML-% IV SOLN
2.5000 [IU]/h | INTRAVENOUS | Status: DC
  Administered 2022-07-16: 2.5 [IU]/h via INTRAVENOUS

## 2022-07-16 MED ORDER — MISOPROSTOL 200 MCG PO TABS
ORAL_TABLET | ORAL | Status: AC
  Filled 2022-07-16: qty 4

## 2022-07-16 MED ORDER — ONDANSETRON HCL 4 MG/2ML IJ SOLN: 4.0000 mg | Freq: Four times a day (QID) | INTRAMUSCULAR | Status: DC | PRN

## 2022-07-16 MED ORDER — AMMONIA AROMATIC IN INHA
RESPIRATORY_TRACT | Status: AC
  Filled 2022-07-16: qty 10

## 2022-07-16 MED ORDER — OXYTOCIN BOLUS FROM INFUSION
333.0000 mL | Freq: Once | INTRAVENOUS | Status: AC
  Administered 2022-07-16: 333 mL via INTRAVENOUS

## 2022-07-16 MED ORDER — PRENATAL MULTIVITAMIN CH
1.0000 | ORAL_TABLET | Freq: Every day | ORAL | Status: DC
  Administered 2022-07-17: 1 via ORAL
  Filled 2022-07-16: qty 1

## 2022-07-16 MED ORDER — MISOPROSTOL 25 MCG QUARTER TABLET
25.0000 ug | ORAL_TABLET | Freq: Once | ORAL | Status: AC
  Administered 2022-07-16: 25 ug via ORAL
  Filled 2022-07-16: qty 1

## 2022-07-16 MED ORDER — DOCUSATE SODIUM 100 MG PO CAPS
100.0000 mg | ORAL_CAPSULE | Freq: Two times a day (BID) | ORAL | Status: DC
  Administered 2022-07-17 – 2022-07-18 (×3): 100 mg via ORAL
  Filled 2022-07-16 (×3): qty 1

## 2022-07-16 MED ORDER — OXYCODONE-ACETAMINOPHEN 5-325 MG PO TABS: 2.0000 | ORAL_TABLET | ORAL | Status: DC | PRN

## 2022-07-16 MED ORDER — LIDOCAINE-EPINEPHRINE (PF) 1.5 %-1:200000 IJ SOLN
INTRAMUSCULAR | Status: DC | PRN
  Administered 2022-07-16: 3 mL via PERINEURAL

## 2022-07-16 MED ORDER — ONDANSETRON HCL 4 MG PO TABS: 4.0000 mg | ORAL_TABLET | ORAL | Status: DC | PRN

## 2022-07-16 MED ORDER — SIMETHICONE 80 MG PO CHEW: 80.0000 mg | CHEWABLE_TABLET | ORAL | Status: DC | PRN

## 2022-07-16 MED ORDER — ACETAMINOPHEN 325 MG PO TABS
650.0000 mg | ORAL_TABLET | ORAL | Status: DC | PRN
  Administered 2022-07-16: 650 mg via ORAL
  Filled 2022-07-16: qty 2

## 2022-07-16 MED ORDER — DIBUCAINE (PERIANAL) 1 % EX OINT: 1.0000 | TOPICAL_OINTMENT | CUTANEOUS | Status: DC | PRN

## 2022-07-16 MED ORDER — OXYTOCIN-SODIUM CHLORIDE 30-0.9 UT/500ML-% IV SOLN
1.0000 m[IU]/min | INTRAVENOUS | Status: DC
  Administered 2022-07-16: 4 m[IU]/min via INTRAVENOUS

## 2022-07-16 MED ORDER — LACTATED RINGERS IV SOLN: 500.0000 mL | Freq: Once | INTRAVENOUS | Status: DC

## 2022-07-16 MED ORDER — IBUPROFEN 600 MG PO TABS
600.0000 mg | ORAL_TABLET | Freq: Four times a day (QID) | ORAL | Status: DC
  Administered 2022-07-17 – 2022-07-18 (×5): 600 mg via ORAL
  Filled 2022-07-16 (×6): qty 1

## 2022-07-16 MED ORDER — LIDOCAINE HCL (PF) 1 % IJ SOLN
INTRAMUSCULAR | Status: DC | PRN
  Administered 2022-07-16: 3 mL

## 2022-07-16 MED ORDER — FENTANYL-BUPIVACAINE-NACL 0.5-0.125-0.9 MG/250ML-% EP SOLN
EPIDURAL | Status: AC
  Filled 2022-07-16: qty 250

## 2022-07-16 NOTE — Progress Notes (Signed)
RN contacted provider to review fetal heart tracing after minimal variability and decelerations after multiple position changes and interventions. Provider en route. Will continue to monitor.

## 2022-07-16 NOTE — Progress Notes (Signed)
LABOR NOTE   Kim Oliver 31 y.o.GP@ at [redacted]w[redacted]d SUBJECTIVE:  Denies pressure, or urge to push. Pt does not feel anything.  Analgesia: Epidural, rate decreased   OBJECTIVE:  BP 113/70   Pulse (!) 50   Temp 98.3 F (36.8 C) (Oral)   Resp 18   Ht '4\' 9"'$  (1.448 m)   Wt 82.6 kg   LMP 10/02/2021 (Exact Date)   SpO2 98%   BMI 39.38 kg/m  No intake/output data recorded.  She has shown cervical change. CERVIX: 10 CM:  100%:   0:     SVE:   Dilation: 6 Effacement (%): 100 Station: -2 Exam by:: APhilip AspenCNM CONTRACTIONS: regular, every 1-3 minutes FHR: Fetal heart tracing reviewed. Baseline: 150 bpm, Variability: Good {> 6 bpm), Accelerations: Non-reactive but appropriate for gestational age, and Decelerations: early, late  Category II    Labs: Lab Results  Component Value Date   WBC 10.3 07/16/2022   HGB 13.5 07/16/2022   HCT 38.1 07/16/2022   MCV 98.4 07/16/2022   PLT 179 07/16/2022    ASSESSMENT: 1) Labor curve reviewed.       Progress: Active phase labor.     Membranes: ruptured, meconium moderate       Principal Problem:   Encounter for induction of labor   PLAN: Start pushing   APhilip Aspen CNorth Dakota 07/16/2022 6:15 PM

## 2022-07-16 NOTE — Progress Notes (Signed)
LABOR NOTE   Kim Oliver 30 y.o.GP@ at [redacted]w[redacted]d SUBJECTIVE:  Denies any pain, not able to mover her legs due to epidural  Analgesia: Epidural, decreased rate 8 ml/hr.   OBJECTIVE:  BP 113/70   Pulse (!) 50   Temp 98.3 F (36.8 C) (Oral)   Resp 18   Ht '4\' 9"'$  (1.448 m)   Wt 82.6 kg   LMP 10/02/2021 (Exact Date)   SpO2 98%   BMI 39.38 kg/m  No intake/output data recorded.  She has shown cervical change. CERVIX: 6 cm:  100%:   -2:   mid position:   soft SVE:   Dilation: 6 Effacement (%): 100 Station: -2 Exam by:: APhilip AspenCNM CONTRACTIONS: regular, every 1-4 minutes FHR: Fetal heart tracing reviewed. Baseline: 145 bpm, Variability: moderate ( with episodes of minimal), Accelerations: Non-reactive but appropriate for gestational age, and Decelerations: late x 1 variable x 1  Category II  IUPC placed  Labs: Lab Results  Component Value Date   WBC 10.3 07/16/2022   HGB 13.5 07/16/2022   HCT 38.1 07/16/2022   MCV 98.4 07/16/2022   PLT 179 07/16/2022    ASSESSMENT: 1) Labor curve reviewed.       Progress: Active phase labor.     Membranes: ruptured, meconium moderate     IUPC placed       Principal Problem:   Encounter for induction of labor   PLAN: continue present management Dr. CMarcelline Matesnotified fetal heart rate with episodes of minimal variability, she has reviewed strip remotely. Positive scalp stimulation on exam . Discussed with family, all questions answered.   APhilip Aspen CNM  07/16/2022 4:23 PM

## 2022-07-16 NOTE — H&P (Signed)
OB History & Physical   History of Present Illness:  Chief Complaint: induction of labor/postdates  HPI:  Kim Oliver is a 31 y.o. G1P0 female at 19w0ddated by LMP.  Her pregnancy has been uncomplicated.    She reports irregular contractions.   She denies leakage of fluid.   She denies vaginal bleeding.   She reports fetal movement.    Total weight gain for pregnancy: 17 kg   Obstetrical Problem List: pregnancy Problems (from 12/01/21 to present)     Problem Noted Resolved   Supervision of normal pregnancy 12/01/2021 by FImagene Riches CNM No   Overview Addendum 07/02/2022  3:04 PM by FImagene Riches CNM     Nursing Staff Provider  [place] 07/09/2022  Language  spanish Anatomy [place] Fibroids   Flu Vaccine   Genetic Screen  NIPS: low risk female  TDaP vaccine   04/16/22 Hgb A1C or  GTT Early : Third trimester : 112  Covid    LAB RESULTS   Rhogam  NA Blood Type   O+  Feeding Plan breast Antibody  nega  Contraception  Rubella  immune  Circumcision  RPR   NR  Pediatrician   HBsAg   neg  Support Person Husband  HIV  negative  Prenatal Classes Online  Varicella immune    GBS  (For PCN allergy, check sensitivities) negative  BTL Consent     VBAC Consent  Pap  NILM    Hgb Electro      CF      SMA                  Maternal Medical History:   Past Medical History:  Diagnosis Date   Medical history non-contributory     Past Surgical History:  Procedure Laterality Date   NO PAST SURGERIES      No Known Allergies  Prior to Admission medications   Medication Sig Start Date End Date Taking? Authorizing Provider  Prenatal Vit-Fe Fumarate-FA (PRENATAL VITAMIN PO) Take by mouth.   Yes [provider]  famotidine (PEPCID) 20 MG tablet Take 1 tablet (20 mg total) by mouth daily. Patient not taking: Reported on 07/02/2022 04/16/22   CApril Manson MD    OB History  Gravida Para Term Preterm AB Living  1             SAB IAB Ectopic Multiple Live Births               # Outcome Date GA Lbr Len/2nd Weight Sex Delivery Anes PTL Lv  1 Current             Prenatal care site: [facility]  Social History: She  reports that she has never smoked. She does not have any smokeless tobacco history on file. She reports that she does not drink alcohol and does not use drugs.  Family History: family history includes Diabetes in her paternal grandfather; Heart disease in her paternal grandfather.    Review of Systems:  Review of Systems  Constitutional:  Negative for chills and fever.  HENT:  Negative for congestion, ear discharge, ear pain, hearing loss, sinus pain and sore throat.   Eyes:  Negative for blurred vision and double vision.  Respiratory:  Negative for cough, shortness of breath and wheezing.   Cardiovascular:  Negative for chest pain, palpitations and leg swelling.  Gastrointestinal:  Positive for abdominal pain. Negative  for blood in stool, constipation, diarrhea, heartburn, melena, nausea and vomiting.  Genitourinary:  Negative for dysuria, flank pain, frequency, hematuria and urgency.  Musculoskeletal:  Negative for back pain, joint pain and myalgias.  Skin:  Negative for itching and rash.  Neurological:  Negative for dizziness, tingling, tremors, sensory change, speech change, focal weakness, seizures, loss of consciousness, weakness and headaches.  Endo/Heme/Allergies:  Negative for environmental allergies. Does not bruise/bleed easily.  Psychiatric/Behavioral:  Negative for depression, hallucinations, memory loss, substance abuse and suicidal ideas. The patient is not nervous/anxious and does not have insomnia.      Physical Exam:  BP (!) 125/97   Pulse (!) 52   Temp 98.3 F (36.8 C) (Oral)   Resp 20   Ht '4\' 9"'$  (1.448 m)   Wt 82.6 kg   LMP 10/02/2021 (Exact Date)   BMI 39.38 kg/m   Constitutional: Well nourished, well developed female in no acute distress.  HEENT:  normal Skin: Warm and dry.  Cardiovascular: Regular rate and rhythm.   Extremity:  no edema   Respiratory: Clear to auscultation bilateral. Normal respiratory effort Abdomen: FHT present Back: no CVAT Neuro: DTRs 2+, Cranial nerves grossly intact Psych: Alert and Oriented x3. No memory deficits. Normal mood and affect.    Pelvic exam: per RN B. Torrence 2/50/-3   Baseline FHR: 145 beats/min   Variability: moderate   Accelerations: present   Decelerations: absent Contractions: present frequency: every 2-5 Overall assessment: reassuring   No results found for: "SARSCOV2NAA"  Assessment:  Kim Oliver is a 31 y.o. G1P0 female at 69w0dwith induction of labor for postdates.   Plan:  Admit to Labor & Delivery  CBC, T&S, Clrs, IVF GBS negative.   Fetal well-being: Category I Continue induction with cytotec 2[place] CNM 07/16/2022 6:58 AM

## 2022-07-16 NOTE — Anesthesia Procedure Notes (Addendum)
Epidural Patient location during procedure: OB Start time: 07/16/2022 1:07 PM End time: 07/16/2022 1:17 PM  Staffing Anesthesiologist: Dimas Millin, MD Resident/CRNA: Hedda Slade, CRNA Performed: resident/CRNA   Preanesthetic Checklist Completed: patient identified, IV checked, site marked, risks and benefits discussed, surgical consent, monitors and equipment checked, pre-op evaluation and timeout performed  Epidural Patient position: sitting Prep: ChloraPrep Patient monitoring: heart rate, continuous pulse ox and blood pressure Approach: midline Location: L3-L4 Injection technique: LOR saline  Needle:  Needle type: Tuohy  Needle gauge: 17 G Needle length: 9 cm and 9 Needle insertion depth: 7 cm Catheter type: closed end flexible Catheter size: 19 Gauge Catheter at skin depth: 12 cm Test dose: negative and 1.5% lidocaine with Epi 1:200 K  Assessment Sensory level: T10 Events: blood not aspirated, injection not painful, no injection resistance, no paresthesia and negative IV test  Additional Notes 1 attempt Pt. Evaluated and documentation done after procedure finished. Patient identified. Risks/Benefits/Options discussed with patient including but not limited to bleeding, infection, nerve damage, paralysis, failed block, incomplete pain control, headache, blood pressure changes, nausea, vomiting, reactions to medication both or allergic, itching and postpartum back pain. Confirmed with bedside nurse the patient's most recent platelet count. Confirmed with patient that they are not currently taking any anticoagulation, have any bleeding history or any family history of bleeding disorders. Patient expressed understanding and wished to proceed. All questions were answered. Sterile technique was used throughout the entire procedure. Please see nursing notes for vital signs. Test dose was given through epidural catheter and negative prior to continuing to dose epidural or start  infusion. Warning signs of high block given to the patient including shortness of breath, tingling/numbness in hands, complete motor block, or any concerning symptoms with instructions to call for help. Patient was given instructions on fall risk and not to get out of bed. All questions and concerns addressed with instructions to call with any issues or inadequate analgesia.    Patient tolerated the insertion well without immediate complications.Reason for block:procedure for pain

## 2022-07-16 NOTE — Plan of Care (Signed)

## 2022-07-16 NOTE — Anesthesia Preprocedure Evaluation (Signed)
Anesthesia Evaluation  Patient identified by MRN, date of birth, ID band Patient awake    Reviewed: Allergy & Precautions, H&P , Patient's Chart, lab work & pertinent test results  Airway Mallampati: II       Dental no notable dental hx. (+) Teeth Intact   Pulmonary neg pulmonary ROS,           Cardiovascular negative cardio ROS       Neuro/Psych negative neurological ROS  negative psych ROS   GI/Hepatic Neg liver ROS, GERD  ,  Endo/Other  negative endocrine ROS  Renal/GU negative Renal ROS  negative genitourinary   Musculoskeletal   Abdominal   Peds  Hematology negative hematology ROS (+)   Anesthesia Other Findings   Reproductive/Obstetrics (+) Pregnancy                             Anesthesia Physical Anesthesia Plan  ASA: 2  Anesthesia Plan: Epidural   Post-op Pain Management:    Induction:   PONV Risk Score and Plan:   Airway Management Planned:   Additional Equipment:   Intra-op Plan:   Post-operative Plan:   Informed Consent: I have reviewed the patients History and Physical, chart, labs and discussed the procedure including the risks, benefits and alternatives for the proposed anesthesia with the patient or authorized representative who has indicated his/her understanding and acceptance.       Plan Discussed with: CRNA and Anesthesiologist  Anesthesia Plan Comments:         Anesthesia Quick Evaluation

## 2022-07-16 NOTE — Discharge Summary (Shared)
Postpartum Discharge Summary  Date of Service updated***     Patient Name: Kim Oliver DOB: Mar 27, 1991 MRN: [id number]  Date of admission: 07/16/2022 Delivery date:07/16/2022  Delivering provider: Philip Aspen  Date of discharge: *******  Admitting diagnosis: Encounter for induction of labor [Z34.90] Intrauterine pregnancy: [redacted]w[redacted]d    Secondary diagnosis:  Principal Problem:   Encounter for induction of labor  Additional problems: uterine firbroids   Discharge diagnosis: Term Pregnancy Delivered                                              Post partum procedures:{Postpartum procedures:23558} Augmentation: Pitocin and Cytotec Complications: None  Hospital course: Induction of Labor With Vaginal Delivery   31y.o. yo G1P0 at 445w0das admitted to the hospital 07/16/2022 for induction of labor.  Indication for induction: Postdates.  Patient had an uncomplicated labor course as follows: Membrane Rupture Time/Date: 10:05 AM ,07/16/2022   Delivery Method:Vaginal, Spontaneous  Episiotomy: None  Lacerations:    Details of delivery can be found in separate delivery note.  Patient had a routine postpartum course. Patient is discharged home 07/16/22.  Newborn Data: Birth date:07/16/2022  Birth time:6:39 PM  Gender:Female  Living status:Living  Apgars:6 ,8  Weight:   Magnesium Sulfate received: No BMZ received: No Rhophylac:No MMR:No T-DaP:Given prenatally Flu: N/A Transfusion:No  Physical exam  Vitals:   07/16/22 1408 07/16/22 1438 07/16/22 1538 07/16/22 1545  BP: (!) 99/54 (!) 104/57 113/70   Pulse: (!) 59 (!) 58 (!) 50   Resp:      Temp:    98.3 F (36.8 C)  TempSrc:    Oral  SpO2:      Weight:      Height:       General: {Exam; general:21111117} Lochia: {Desc; appropriate/inappropriate:30686::"appropriate"} Uterine Fundus: {Desc; firm/soft:30687} Incision: {Exam; incision:21111123} DVT Evaluation: {Exam; dvt:2111122} Labs: Lab Results  Component Value  Date   WBC 10.3 07/16/2022   HGB 13.5 07/16/2022   HCT 38.1 07/16/2022   MCV 98.4 07/16/2022   PLT 179 07/16/2022      Latest Ref Rng & Units 12/22/2021    3:02 PM  CMP  Glucose 70 - 99 mg/dL 86   BUN 6 - 20 mg/dL 10   Creatinine 0.44 - 1.00 mg/dL 0.46   Sodium 135 - 145 mmol/L 132   Potassium 3.5 - 5.1 mmol/L 3.5   Chloride 98 - 111 mmol/L 102   CO2 22 - 32 mmol/L 23   Calcium 8.9 - 10.3 mg/dL 9.1   Total Protein 6.5 - 8.1 g/dL 7.9   Total Bilirubin 0.3 - 1.2 mg/dL 0.4   Alkaline Phos 38 - 126 U/L 44   AST 15 - 41 U/L 17   ALT 0 - 44 U/L 16    Edinburgh Score:     No data to display            After visit meds:  Allergies as of 07/16/2022   No Known Allergies   Med Rec must be completed prior to using this [facility]        Discharge home in stable condition Infant Feeding: {Baby feeding:23562} Infant Disposition:{CHL IP OB HOME WITH MOQBHALP:37902}ischarge instruction: per After Visit Summary and Postpartum booklet. Activity: Advance as tolerated. Pelvic rest for 6 weeks.  Diet: {OB diIOXB:35329924}nticipated Birth Control: {Birth Control:23956} Postpartum Appointment:{Outpatient  follow up:23559} Additional Postpartum F/U: {PP Procedure:23957} Future Appointments:No future appointments. Follow up Visit:

## 2022-07-16 NOTE — Progress Notes (Signed)
LABOR NOTE   Kim Oliver 30 y.o.GP@ at [redacted]w[redacted]d SUBJECTIVE:  Feeling better with epidural  Analgesia: Epidural  OBJECTIVE:  BP (!) 112/59   Pulse (!) 58   Temp 97.9 F (36.6 C) (Oral)   Resp 18   Ht '4\' 9"'$  (1.448 m)   Wt 82.6 kg   LMP 10/02/2021 (Exact Date)   SpO2 100%   BMI 39.38 kg/m  No intake/output data recorded.  She has shown cervical change. CERVIX: per RN exam  SVE:   Dilation: 4 Effacement (%): 80 Station: -2 Exam by:: B. NRicky AlaRN CONTRACTIONS: regular, every 3-4 minutes FHR: Fetal heart tracing reviewed. Baseline: 145 bpm, Variability: Good {> 6 bpm), Accelerations: Non-reactive but appropriate for gestational age, and Decelerations: Absent Category II    Labs: Lab Results  Component Value Date   WBC 10.3 07/16/2022   HGB 13.5 07/16/2022   HCT 38.1 07/16/2022   MCV 98.4 07/16/2022   PLT 179 07/16/2022    ASSESSMENT: 1) Labor curve reviewed.       Progress: Early latent labor.     Membranes: ruptured, meconium moderate       Principal Problem:   Encounter for induction of labor   PLAN: continue present management   APhilip Aspen CNM  07/16/2022 1:57 PM

## 2022-07-16 NOTE — Progress Notes (Signed)
LABOR NOTE   Kim Oliver 31 y.o.GP@ at [redacted]w[redacted]d SUBJECTIVE:  Feeling comfortable .  Analgesia: Labor support without medications  OBJECTIVE:  BP 112/69 (BP Location: Left Arm)   Pulse (!) 43   Temp 98.1 F (36.7 C) (Oral)   Resp 18   Ht '4\' 9"'$  (1.448 m)   Wt 82.6 kg   LMP 10/02/2021 (Exact Date)   SpO2 100%   BMI 39.38 kg/m  No intake/output data recorded.  She has shown cervical change. CERVIX: 3 cm:  70%:   -2:   posterior:   firm SVE:   Dilation: 2 Effacement (%): 50 Station: -3 Exam by:: Bria Torrence RN CONTRACTIONS: regular, every 2-5 minutes FHR: Fetal heart tracing reviewed. Baseline: 155 bpm, Variability: Good {> 6 bpm), Accelerations: Non-reactive but appropriate for gestational age, and Decelerations: occasional variable  Category II    Labs: Lab Results  Component Value Date   WBC 10.3 07/16/2022   HGB 13.5 07/16/2022   HCT 38.1 07/16/2022   MCV 98.4 07/16/2022   PLT 179 07/16/2022    ASSESSMENT: 1) Labor curve reviewed.       Progress: Early latent labor.     Membranes: ruptured, meconium moderate   Principal Problem:   Encounter for induction of labor Postdates   PLAN: IV Pitocin augmentation   APhilip Aspen CNM  07/16/2022 11:02 AM

## 2022-07-17 LAB — CBC
HCT: 32.7 % — ABNORMAL LOW (ref 36.0–46.0)
Hemoglobin: 11.5 g/dL — ABNORMAL LOW (ref 12.0–15.0)
MCH: 35.1 pg — ABNORMAL HIGH (ref 26.0–34.0)
MCHC: 35.2 g/dL (ref 30.0–36.0)
MCV: 99.7 fL (ref 80.0–100.0)
Platelets: 147 10*3/uL — ABNORMAL LOW (ref 150–400)
RBC: 3.28 MIL/uL — ABNORMAL LOW (ref 3.87–5.11)
RDW: 13.5 % (ref 11.5–15.5)
WBC: 14 10*3/uL — ABNORMAL HIGH (ref 4.0–10.5)
nRBC: 0 % (ref 0.0–0.2)

## 2022-07-17 MED ORDER — IBUPROFEN 600 MG PO TABS: 600.0000 mg | ORAL_TABLET | Freq: Four times a day (QID) | ORAL | 0 refills | Status: DC | PRN

## 2022-07-17 MED ORDER — COCONUT OIL OIL: 1.0000 | TOPICAL_OIL | 0 refills | Status: DC | PRN

## 2022-07-17 MED ORDER — ACETAMINOPHEN 325 MG PO TABS: 650.0000 mg | ORAL_TABLET | ORAL | Status: DC | PRN

## 2022-07-17 NOTE — Lactation Note (Signed)
This note was copied from a baby's chart. Lactation Consultation Note  Patient Name: Kim Oliver [id number] Date: 07/17/2022 Reason for consult: Follow-up assessment;Primapara Age:29 hours BAby has not voided since birth  Maternal Data  Mom desires to pump breasts with her Lansinoh breast pump she was sent from insurance so that she can learn how to use  Feeding Mother's Current Feeding Choice: Breast Milk Mom assisted with waking baby and baby latched easily to right breast after expressing drops of colostrum, in cradle hold, nursed 30 min with spontaneous audible swallows, mom pumped left breast while baby on right, obtained approx 1/4 teaspoon drawn into curved tip syringe, given to baby in cheek, then mom latched baby to left breast in cradle hold, while nursing she is pumping right breast, to bottle feed with slow flow nipple by paced feeding 10 cc Sim with FE /EBM after breastfeeding until baby voids  LATCH Scor Latch: Grasps breast easily, tongue down, lips flanged, rhythmical sucking.  Audible Swallowing: Spontaneous and intermittent  Type of Nipple: Everted at rest and after stimulation  Comfort (Breast/Nipple): Soft / non-tender  Hold (Positioning): No assistance needed to correctly position infant at breast.  LATCH Score: 10   Lactation Tools Discussed/Used Tools: Pump Breast pump type: Double-Electric Breast Pump;Other (comment) (pt's own lansinoh breast pump) Pump Education: Setup, frequency, and cleaning;Milk Storage Reason for Pumping: for supplement Pumping frequency: prn Pumped volume:  (approx 1 teaspoon from both breasts) Call for assist with giving pumped EBM per syringe or spoon Interventions Interventions: Assisted with latch;Support pillows;Education;Pace feeding;Expressed milk  Discharge Pump: Personal  Consult Status Consult Status: Follow-up Date: 07/18/22 Follow-up type: In-patient    [name] 07/17/2022, 7:06 PM

## 2022-07-17 NOTE — Lactation Note (Addendum)
This note was copied from a baby's chart. Lactation Consultation Note  Patient Name: Kim Oliver [id number] Date: 07/17/2022 Reason for consult: Initial assessment;Primapara;Term Age:29 hours  Maternal Data Has patient been taught Hand Expression?: Yes Does the patient have breastfeeding experience prior to this delivery?: No  Feeding Mother's Current Feeding Choice: Breast Milk BAby has not fed at breast since 0330, asleep on mom's chest, baby awakened,diaper changed, baby started rooting, mom shown how to hand express drops of colostrum on right, baby latched easily to breast and began nursing well with occ audible swallows, nursed x 20 min, came off breast and mom shown how to burp baby, switched to left breast after hand expressing drops of colostrum, latched easily and is nursing well   LATCH Score Latch: Grasps breast easily, tongue down, lips flanged, rhythmical sucking.  Audible Swallowing: A few with stimulation  Type of Nipple: Everted at rest and after stimulation  Comfort (Breast/Nipple): Soft / non-tender  Hold (Positioning): Assistance needed to correctly position infant at breast and maintain latch.  LATCH Score: 8   Lactation Tools Discussed/Used  LC name and no written on white board in room  Interventions Interventions: Breast feeding basics reviewed;Assisted with latch;Breast compression;Adjust position;Hand express;Support pillows;Education (purelan given with instruction in use)  Discharge Pump: Personal [place] Program: No  Consult Status Consult Status: PRN    Kim Oliver 07/17/2022, 11:07 AM

## 2022-07-17 NOTE — Anesthesia Postprocedure Evaluation (Signed)
Anesthesia Post Note  Patient: Kim Oliver  Procedure(s) Performed: AN AD Bowles  Patient location during evaluation: Mother Baby Anesthesia Type: Epidural Level of consciousness: awake Respiratory status: spontaneous breathing Cardiovascular status: stable Postop Assessment: no headache Anesthetic complications: no   No notable events documented.   Last Vitals:  Vitals:   07/16/22 2302 07/17/22 0400  BP: (!) 97/50 110/66  Pulse: (!) 52 60  Resp: 18 18  Temp: 36.7 C 37.1 C  SpO2: 99% 100%    Last Pain:  Vitals:   07/17/22 0400  TempSrc: Oral  PainSc: 2                  Kim Oliver

## 2022-07-18 NOTE — Discharge Summary (Signed)
Postpartum Discharge Summary  Date of Service updated 07/18/2022     Patient Name: Kim Oliver DOB: Dec 17, 1990 MRN: [id number]  Date of admission: 07/16/2022 Delivery date:07/16/2022  Delivering provider: Philip Aspen  Date of discharge:07/18/2022  Admitting diagnosis: Encounter for induction of labor [Z34.90] Intrauterine pregnancy: [redacted]w[redacted]d    Secondary diagnosis:  Principal Problem:   Encounter for induction of labor  Additional problems: uterine firbroids   Discharge diagnosis: Term Pregnancy Delivered                                              Post partum procedures:NA Augmentation: Pitocin and Cytotec Complications: None  Hospital course: Induction of Labor With Vaginal Delivery   31y.o. yo G1P0 at 426w0das admitted to the hospital 07/16/2022 for induction of labor.  Indication for induction: Postdates.  Patient had an uncomplicated labor course as follows: Membrane Rupture Time/Date: 10:05 AM ,07/16/2022   Delivery Method:Vaginal, Spontaneous  Episiotomy: None  Lacerations:    Details of delivery can be found in separate delivery note.  Patient had a routine postpartum course. Ambulating and voiding without difficulty.  Bleeding WNL.Breastfeeding independently. Mood is good. Has good support.  Patient is discharged home 07/18/22.  Newborn Data: Birth date:07/16/2022  Birth time:6:39 PM  Gender:Female  Living status:Living  Apgars:7 ,8  Weight:3810 g   Magnesium Sulfate received: No BMZ received: No Rhophylac:No MMR:No T-DaP:Given prenatally FlZPH:XTAVWPVTransfusion:No  Exam General: alert, cooperative, no distress Cardiac: RRR, no murmur Lungs: CTAB Abdomen: soft, non-tender, normal bowel sounds Fundus: firm Lochia: WNL Extremities: no s/s of DVT, no edema  BP 104/62 (BP Location: Left Arm)   Pulse 68   Temp 97.6 F (36.4 C) (Oral)   Resp 16   Ht 4' 9"  (1.448 m)   Wt 82.6 kg   LMP 10/02/2021 (Exact Date)   SpO2 100%   Breastfeeding Unknown    BMI 39.38 kg/m      Latest Ref Rng & Units 07/17/2022    5:39 AM 07/16/2022    1:52 AM 04/16/2022   11:15 AM  CBC  WBC 4.0 - 10.5 K/uL 14.0  10.3  12.7   Hemoglobin 12.0 - 15.0 g/dL 11.5  13.5  12.5   Hematocrit 36.0 - 46.0 % 32.7  38.1  36.0   Platelets 150 - 400 K/uL 147  179  266        07/17/2022    4:00 AM  Edinburgh Postnatal Depression Scale Screening Tool  I have been able to laugh and see the funny side of things. 0  I have looked forward with enjoyment to things. 0  I have blamed myself unnecessarily when things went wrong. 1  I have been anxious or worried for no good reason. 0  I have felt scared or panicky for no good reason. 0  Things have been getting on top of me. 0  I have been so unhappy that I have had difficulty sleeping. 0  I have felt sad or miserable. 0  I have been so unhappy that I have been crying. 0  The thought of harming myself has occurred to me. 0  Edinburgh Postnatal Depression Scale Total 1    Discharge home in stable condition Infant feeding: Breast, supplementing formula Infant disposition: home with mother Discharge instrcution: per After Visit summary and Postpartum booklet Activity: Advance as tolerated.  Pelvic rest for 6 weeks Anticipated birth control: condoms Postpartum appointment: 2 weeks and 6 weeks    Lloyd Huger, CNM 9:13 AM 07/18/22

## 2022-07-18 NOTE — Progress Notes (Signed)
Patient discharged home with family.  Discharge instructions, when to follow up, and prescriptions reviewed with patient.  Patient verbalized understanding. Patient will be escorted out by auxiliary.   

## 2022-07-18 NOTE — Final Progress Note (Signed)
Post Partum Day 2 Subjective: [name] is feeling well and would like to go home today. She stayed overnight because the baby had not yet voided; he has now voided once. Kim Oliver is ambulating and voiding without difficulty and tolerating POs. Her pain is well-controlled and bleeding is WNL. She is working on breastfeeding. Will supplement with formula after each feed if output is not adequate.  Objective: Blood pressure 104/62, pulse 68, temperature 97.6 F (36.4 C), temperature source Oral, resp. rate 16, height '4\' 9"'$  (1.448 m), weight 82.6 kg, last menstrual period 10/02/2021, SpO2 100 %, unknown if currently breastfeeding.  Physical Exam:  General: alert, cooperative, and appears stated age Heart: RRR, no murmur Lungs: CTAB Lochia: appropriate Abdomen: soft, non-tender, normal bowel sounds Uterine Fundus: firm Incision: N/A DVT Evaluation: No evidence of DVT seen on physical exam.  Recent Labs    07/16/22 0152 07/17/22 0539  HGB 13.5 11.5*  HCT 38.1 32.7*    Assessment/Plan: Discharge home Breastfeeding Discharge instructions reviewed Follow up at 2 weeks and 6 weeks postpartum   LOS: 2 days   Lurlean Horns, CNM 07/18/2022, 9:17 AM

## 2022-07-18 NOTE — Progress Notes (Signed)
Pt was to be discharged at 24 hours, noted created to reflect pt remaining in patient  Obstetric Postpartum Daily Progress Note Subjective:  31 y.o. G1P1001 postpartum day #1 status post vaginal delivery.  She is ambulating, is tolerating po, is voiding spontaneously.  Her pain is well controlled on PO pain medications. Her lochia is WNL.  Breastfeeding is going ok having some latch difficulties.    Medications SCHEDULED MEDICATIONS   docusate sodium  100 mg Oral BID   ferrous sulfate  325 mg Oral Q breakfast   ibuprofen  600 mg Oral Q6H   prenatal multivitamin  1 tablet Oral Q1200   senna-docusate  2 tablet Oral Q24H    MEDICATION INFUSIONS   lactated ringers Stopped (07/16/22 2150)   oxytocin Stopped (07/16/22 2150)    PRN MEDICATIONS  acetaminophen, benzocaine-Menthol, coconut oil, witch hazel-glycerin **AND** dibucaine, diphenhydrAMINE, lactated ringers, methylergonovine **OR** methylergonovine, ondansetron, ondansetron **OR** ondansetron (ZOFRAN) IV, oxyCODONE-acetaminophen, oxyCODONE-acetaminophen, simethicone    Objective:   Vitals:   07/17/22 1235 07/17/22 1618 07/17/22 2030 07/18/22 0008  BP:  98/72 (!) 102/58 (!) 100/57  Pulse:  68 66 64  Resp:   18 18  Temp: 98.7 F (37.1 C) 97.8 F (36.6 C) 98.6 F (37 C) 97.7 F (36.5 C)  TempSrc: Axillary  Oral Oral  SpO2:   98% 100%  Weight:      Height:        Current Vital Signs 24h Vital Sign Ranges  T 97.7 F (36.5 C) Temp  Avg: 98.2 F (36.8 C)  Min: 97.7 F (36.5 C)  Max: 98.7 F (37.1 C)  BP (!) 100/57 BP  Min: 98/72  Max: 107/72  HR 64 Pulse  Avg: 64  Min: 58  Max: 68  RR 18 Resp  Avg: 18  Min: 18  Max: 18  SaO2 100 % Room Air SpO2  Avg: 99.3 %  Min: 98 %  Max: 100 %       24 Hour I/O Current Shift I/O  Time Ins Outs No intake/output data recorded. No intake/output data recorded.   Labs:  Recent Labs  Lab 07/16/22 0152 07/17/22 0539  WBC 10.3 14.0*  HGB 13.5 11.5*  HCT 38.1 32.7*  PLT 179 147*    General: alert Breasts: soft no redness or masses, nipples erect and intact bilaterally.  Lochia: appropriate Uterine Fundus: firm Incision: N/A Perineum: slightly swollen DVT Evaluation: No evidence of DVT seen on physical exam. Extremities: trace BLE   Assessment:   31 y.o. G1P1001 postpartum day # 1 status post SVB  Plan:   1) Acute blood loss anemia - hemodynamically stable and asymptomatic - po ferrous sulfate  2) O POS Performed at [facility], [address] [place], [place] [address]  / Charlynn Grimes 1.86 (04/26 1448)/ Varicella Immune  3) TDAP status received prenatally   4) breast /Contraception =  unsure  5) Disposition discharge 9/16  Jillene Bucks [facility], CNM  07/18/2022 7:13 AM

## 2022-08-03 ENCOUNTER — Encounter: Payer: Self-pay | Admitting: Obstetrics & Gynecology

## 2022-08-03 ENCOUNTER — Ambulatory Visit (INDEPENDENT_AMBULATORY_CARE_PROVIDER_SITE_OTHER): Payer: 59 | Admitting: Obstetrics

## 2022-08-03 ENCOUNTER — Encounter: Payer: Self-pay | Admitting: Obstetrics

## 2022-08-03 NOTE — Progress Notes (Signed)
Postpartum Visit  Chief Complaint: No chief complaint on file.   History of Present Illness: Patient is a 31 y.o. G1P1001 presents for postpartum visit.  Date of delivery: 07/16/2022 Type of delivery: Vaginal delivery - Vacuum or forceps assisted  no Episiotomy No.  Laceration: yes; superficial lacerations did not require repair. Pregnancy or labor problems:  no Any problems since the delivery:  no  Newborn Details:  SINGLETON :  1. Baby's name: boy. Birth weight: 3810  gms Maternal Details:  Breast Feeding:  yes Post partum depression/anxiety noted:  no Edinburgh Post-Partum Depression Score:  3  Date of last PAP: 2023  normal NILM  Past Medical History:  Diagnosis Date   Medical history non-contributory     Past Surgical History:  Procedure Laterality Date   NO PAST SURGERIES      Prior to Admission medications   Medication Sig Start Date End Date Taking? Authorizing Provider  acetaminophen (TYLENOL) 325 MG tablet Take 2 tablets (650 mg total) by mouth every 4 (four) hours as needed (for pain scale < 4  OR  temperature  >/=  100.5 F). 07/17/22   Dominic, Nunzio Cobbs, CNM  coconut oil OIL Apply 1 Application topically as needed. 07/17/22   Dominic, Nunzio Cobbs, CNM  ibuprofen (ADVIL) 600 MG tablet Take 1 tablet (600 mg total) by mouth every 6 (six) hours as needed. 07/17/22   Dominic, Nunzio Cobbs, CNM  Prenatal Vit-Fe Fumarate-FA (PRENATAL VITAMIN PO) Take by mouth.    [provider]    No Known Allergies   Social History   Socioeconomic History   Marital status: Married    Spouse name: Not on file   Number of children: Not on file   Years of education: Not on file   Highest education level: Not on file  Occupational History   Occupation: Packing    Comment: Sales executive  Tobacco Use   Smoking status: Never   Smokeless tobacco: Not on file  Vaping Use   Vaping Use: Never used  Substance and Sexual Activity   Alcohol use: Never   Drug use: Never    Sexual activity: Yes  Other Topics Concern   Not on file  Social History Narrative   Not on file   Social Determinants of Health   Financial Resource Strain: Not on file  Food Insecurity: No Food Insecurity (07/16/2022)   Hunger Vital Sign    Worried About Running Out of Food in the Last Year: Never true    Ran Out of Food in the Last Year: Never true  Transportation Needs: No Transportation Needs (07/16/2022)   PRAPARE - Hydrologist (Medical): No    Lack of Transportation (Non-Medical): No  Physical Activity: Not on file  Stress: Not on file  Social Connections: Not on file  Intimate Partner Violence: Not on file    Family History  Problem Relation Age of Onset   Heart disease Paternal Grandfather    Diabetes Paternal Grandfather     ROS   Physical Exam LMP 10/02/2021 (Exact Date)   OBGyn Exam   Female Chaperone present during breast and/or pelvic exam.  Assessment: 31 y.o. G1P1001 presenting for 2 week postpartum visit  Plan: Problem List Items Addressed This Visit   None    1) Contraception Education given regarding options for contraception, including barrier methods.     Patient underwent screening for postpartum depression with no concerns noted.  4) Follow up 1 month for  routine her 6 week postpartum exam  Imagene Riches, CNM  08/03/2022 11:14 AM   08/03/2022 11:13 AM

## 2022-08-06 NOTE — Progress Notes (Signed)
Subjective:    Kim Oliver is a 31 y.o. female being seen today for her obstetrical visit. She is at 63w4dgestation. Patient reports occasional contractions. Fetal movement: normal.  Menstrual History: OB History     Gravida  1   Para  1   Term  1   Preterm      AB      Living  1      SAB      IAB      Ectopic      Multiple  0   Live Births  1            Patient's last menstrual period was 10/02/2021 (exact date).    The following portions of the patient's history were reviewed and updated as appropriate: allergies, current medications, past family history, past medical history, past social history, past surgical history, and problem list.  Review of Systems Pertinent items are noted in HPI.   Objective:    BP 102/60   Wt 182 lb 6.4 oz (82.7 kg)   LMP 10/02/2021 (Exact Date)   BMI 39.47 kg/m  FHT:  139 BPM  Uterine Size: 40 cm  Presentations: cephalic  Pelvic Exam:              Dilation:        Effacement:    Station:       Consistency:             Position:       Assessment:    Pregnancy 40 and 4/7 weeks   Plan:    Postdates management: labor precautions.  Follow as scheduled  CRosario Adie MD  08/16/2022 11:33 PM

## 2022-08-31 ENCOUNTER — Ambulatory Visit (INDEPENDENT_AMBULATORY_CARE_PROVIDER_SITE_OTHER): Payer: 59 | Admitting: Obstetrics

## 2022-08-31 ENCOUNTER — Encounter: Payer: Self-pay | Admitting: Obstetrics

## 2022-08-31 NOTE — Progress Notes (Signed)
Postpartum Visit  Chief Complaint:  Chief Complaint  Patient presents with   Postpartum Care    History of Present Illness: Patient is a 31 y.o. G1P1001 presents for postpartum visit.she reports that she is doing well. No PP mood issues. She just started a period. Plans to use condoms. Is breastfeeding without supplementation  Date of delivery: 07/16/2022 Type of delivery: Vaginal delivery - Vacuum or forceps assisted  no Episiotomy No.  Laceration: no  Pregnancy or labor problems:  no Any problems since the delivery:  no  Newborn Details:  SINGLETON :  1. Baby's name: female. Birth weight: 3810gms Maternal Details:  Breast Feeding:  yes Post partum depression/anxiety noted:  no Edinburgh Post-Partum Depression Score:  see MA note  Date of last PAP: 2022  normal   Past Medical History:  Diagnosis Date   Medical history non-contributory     Past Surgical History:  Procedure Laterality Date   NO PAST SURGERIES      Prior to Admission medications   Medication Sig Start Date End Date Taking? Authorizing Provider  Prenatal Vit-Fe Fumarate-FA (PRENATAL VITAMIN PO) Take by mouth.    [provider]    No Known Allergies   Social History   Socioeconomic History   Marital status: Married    Spouse name: Not on file   Number of children: Not on file   Years of education: Not on file   Highest education level: Not on file  Occupational History   Occupation: Packing    Comment: Sales executive  Tobacco Use   Smoking status: Never   Smokeless tobacco: Not on file  Vaping Use   Vaping Use: Never used  Substance and Sexual Activity   Alcohol use: Never   Drug use: Never   Sexual activity: Yes  Other Topics Concern   Not on file  Social History Narrative   Not on file   Social Determinants of Health   Financial Resource Strain: Not on file  Food Insecurity: No Food Insecurity (07/16/2022)   Hunger Vital Sign    Worried About Running Out of Food in the  Last Year: Never true    Ran Out of Food in the Last Year: Never true  Transportation Needs: No Transportation Needs (07/16/2022)   PRAPARE - Hydrologist (Medical): No    Lack of Transportation (Non-Medical): No  Physical Activity: Not on file  Stress: Not on file  Social Connections: Not on file  Intimate Partner Violence: Not on file    Family History  Problem Relation Age of Onset   Heart disease Paternal Grandfather    Diabetes Paternal Grandfather     Review of Systems  Constitutional: Negative.   Eyes: Negative.   Respiratory: Negative.    Cardiovascular: Negative.   Neurological: Negative.   All other systems reviewed and are negative.    Physical Exam BP 100/64   Pulse 96   Wt 152 lb (68.9 kg)   Breastfeeding Yes   BMI 32.89 kg/m   Physical Exam Constitutional:      Appearance: Normal appearance. She is normal weight.  Genitourinary:     Vulva and rectum normal.     Genitourinary Comments: Uterus is involuted, nonenlarged. Anteverted. Normal appearing perineum, vagina.  Cardiovascular:     Rate and Rhythm: Normal rate and regular rhythm.     Pulses: Normal pulses.     Heart sounds: Normal heart sounds.  Pulmonary:     Effort: Pulmonary effort  is normal.     Breath sounds: Normal breath sounds.  Abdominal:     General: Abdomen is flat.     Palpations: Abdomen is soft.  Musculoskeletal:     Cervical back: Normal range of motion and neck supple.  Neurological:     General: No focal deficit present.     Mental Status: She is alert and oriented to person, place, and time.  Skin:    General: Skin is warm and dry.  Psychiatric:        Mood and Affect: Mood normal.        Behavior: Behavior normal.      Female Chaperone present during breast and/or pelvic exam.  Assessment: 31 y.o. G1P1001 presenting for 6 week postpartum visit  Plan: Problem List Items Addressed This Visit       Other   Postpartum care following  vaginal delivery - Primary     1) Contraception Education given regarding options for contraception, including barrier methods.  2)  Pap - ASCCP guidelines and rational discussed.  Patient opts for annual screening interval  3) Patient underwent screening for postpartum depression with no concerns noted.  4) Follow up 1 year for routine annual exam  Prentice Docker, MD 08/31/2022 12:43 PM

## 2022-09-22 ENCOUNTER — Emergency Department: Payer: 59

## 2022-09-22 ENCOUNTER — Other Ambulatory Visit: Payer: Self-pay

## 2022-09-22 ENCOUNTER — Emergency Department
Admission: EM | Admit: 2022-09-22 | Discharge: 2022-09-22 | Disposition: A | Discharge status: Home / Self Care | Payer: 59 | Attending: Student in an Organized Health Care Education/Training Program | Admitting: Student in an Organized Health Care Education/Training Program

## 2022-09-22 DIAGNOSIS — M79645 Pain in left finger(s): Secondary | ICD-10-CM | POA: Diagnosis not present

## 2022-09-22 DIAGNOSIS — S8992XA Unspecified injury of left lower leg, initial encounter: Secondary | ICD-10-CM | POA: Diagnosis present

## 2022-09-22 DIAGNOSIS — S8002XA Contusion of left knee, initial encounter: Secondary | ICD-10-CM | POA: Diagnosis not present

## 2022-09-22 DIAGNOSIS — Y92410 Unspecified street and highway as the place of occurrence of the external cause: Secondary | ICD-10-CM | POA: Diagnosis not present

## 2022-09-22 DIAGNOSIS — R930 Abnormal findings on diagnostic imaging of skull and head, not elsewhere classified: Secondary | ICD-10-CM | POA: Insufficient documentation

## 2022-09-22 MED ORDER — LIDOCAINE 5 % EX PTCH: 1.0000 | MEDICATED_PATCH | Freq: Two times a day (BID) | CUTANEOUS | 0 refills | Status: AC

## 2022-09-22 MED ORDER — ACETAMINOPHEN 500 MG PO TABS: 1000.0000 mg | ORAL_TABLET | Freq: Four times a day (QID) | ORAL | 2 refills | Status: AC | PRN

## 2022-09-22 NOTE — Discharge Instructions (Addendum)
-  You may take Tylenol as needed for pain. You may also utilize the lidocaine patches as well along areas of pain.   -Continue to ice your left knee over the next few days.  -Return to the emergency department anytime if you begin to experience any new or worsening symptoms.
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
## 2022-09-22 NOTE — ED Notes (Signed)
Pt provides verbal consent for MSE waiver. Trackpad in room not functional for pt to sign.

## 2022-09-23 ENCOUNTER — Ambulatory Visit: Payer: Self-pay | Admitting: *Deleted
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
## 2022-09-28 NOTE — Telephone Encounter (Signed)
LMTCB 09/28/2022   Thanks,  -Mickel Baas

## 2022-09-28 NOTE — Telephone Encounter (Signed)
Normal doses is safe to take while breastfeeding. If she is concerned she can always pump and dump after taking medication. Or she can just stick with more natural methods such as heat, massage and stretching.

## 2022-10-01 ENCOUNTER — Encounter: Payer: Self-pay | Admitting: Internal Medicine

## 2022-10-01 ENCOUNTER — Ambulatory Visit (INDEPENDENT_AMBULATORY_CARE_PROVIDER_SITE_OTHER): Payer: 59 | Admitting: Internal Medicine

## 2022-10-01 VITALS — BP 122/80 | HR 61 | Temp 97.1°F | Wt 154.0 lb

## 2022-10-01 DIAGNOSIS — Z23 Encounter for immunization: Secondary | ICD-10-CM

## 2022-10-01 DIAGNOSIS — N6489 Other specified disorders of breast: Secondary | ICD-10-CM

## 2022-10-01 DIAGNOSIS — M545 Low back pain, unspecified: Secondary | ICD-10-CM | POA: Diagnosis not present

## 2022-10-01 DIAGNOSIS — M542 Cervicalgia: Secondary | ICD-10-CM

## 2022-10-01 DIAGNOSIS — M25562 Pain in left knee: Secondary | ICD-10-CM

## 2022-10-01 DIAGNOSIS — M25561 Pain in right knee: Secondary | ICD-10-CM

## 2022-10-01 MED ORDER — METHOCARBAMOL 500 MG PO TABS: 250.0000 mg | ORAL_TABLET | Freq: Three times a day (TID) | ORAL | 0 refills | Status: DC | PRN
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
## 2022-10-01 NOTE — Patient Instructions (Signed)
Neck Exercises Ask your health care provider which exercises are safe for you. Do exercises exactly as told by your health care provider and adjust them as directed. It is normal to feel mild stretching, pulling, tightness, or discomfort as you do these exercises. Stop right away if you feel sudden pain or your pain gets worse. Do not begin these exercises until told by your health care provider. Neck exercises can be important for many reasons. They can improve strength and maintain flexibility in your neck, which will help your upper back and prevent neck pain. Stretching exercises Rotation neck stretching  Sit in a chair or stand up. Place your feet flat on the floor, shoulder-width apart. Slowly turn your head (rotate) to the right until a slight stretch is felt. Turn it all the way to the right so you can look over your right shoulder. Do not tilt or tip your head. Hold this position for 10-30 seconds. Slowly turn your head (rotate) to the left until a slight stretch is felt. Turn it all the way to the left so you can look over your left shoulder. Do not tilt or tip your head. Hold this position for 10-30 seconds. Repeat __________ times. Complete this exercise __________ times a day. Neck retraction  Sit in a sturdy chair or stand up. Look straight ahead. Do not bend your neck. Use your fingers to push your chin backward (retraction). Do not bend your neck for this movement. Continue to face straight ahead. If you are doing the exercise properly, you will feel a slight sensation in your throat and a stretch at the back of your neck. Hold the stretch for 1-2 seconds. Repeat __________ times. Complete this exercise __________ times a day. Strengthening exercises Neck press  Lie on your back on a firm bed or on the floor with a pillow under your head. Use your neck muscles to push your head down on the pillow and straighten your spine. Hold the position as well as you can. Keep your head  facing up (in a neutral position) and your chin tucked. Slowly count to 5 while holding this position. Repeat __________ times. Complete this exercise __________ times a day. Isometrics These are exercises in which you strengthen the muscles in your neck while keeping your neck still (isometrics). Sit in a supportive chair and place your hand on your forehead. Keep your head and face facing straight ahead. Do not flex or extend your neck while doing isometrics. Push forward with your head and neck while pushing back with your hand. Hold for 10 seconds. Do the sequence again, this time putting your hand against the back of your head. Use your head and neck to push backward against the hand pressure. Finally, do the same exercise on either side of your head, pushing sideways against the pressure of your hand. Repeat __________ times. Complete this exercise __________ times a day. Prone head lifts  Lie face-down (prone position), resting on your elbows so that your chest and upper back are raised. Start with your head facing downward, near your chest. Position your chin either on or near your chest. Slowly lift your head upward. Lift until you are looking straight ahead. Then continue lifting your head as far back as you can comfortably stretch. Hold your head up for 5 seconds. Then slowly lower it to your starting position. Repeat __________ times. Complete this exercise __________ times a day. Supine head lifts  Lie on your back (supine position), bending your knees   to point to the ceiling and keeping your feet flat on the floor. Lift your head slowly off the floor, raising your chin toward your chest. Hold for 5 seconds. Repeat __________ times. Complete this exercise __________ times a day. Scapular retraction  Stand with your arms at your sides. Look straight ahead. Slowly pull both shoulders (scapulae) backward and downward (retraction) until you feel a stretch between your shoulder  blades in your upper back. Hold for 10-30 seconds. Relax and repeat. Repeat __________ times. Complete this exercise __________ times a day. Contact a health care provider if: Your neck pain or discomfort gets worse when you do an exercise. Your neck pain or discomfort does not improve within 2 hours after you exercise. If you have any of these problems, stop exercising right away. Do not do the exercises again unless your health care provider says that you can. Get help right away if: You develop sudden, severe neck pain. If this happens, stop exercising right away. Do not do the exercises again unless your health care provider says that you can. This information is not intended to replace advice given to you by your health care provider. Make sure you discuss any questions you have with your health care provider. Document Revised: 04/15/2021 Document Reviewed: 04/15/2021 Elsevier Patient Education  2023 Elsevier Inc.  

## 2022-10-06 ENCOUNTER — Ambulatory Visit: Payer: 59 | Admitting: Internal Medicine

## 2022-11-18 ENCOUNTER — Encounter: Payer: 59 | Admitting: Internal Medicine

## 2022-12-22 ENCOUNTER — Encounter: Payer: Self-pay | Admitting: Internal Medicine

## 2022-12-22 ENCOUNTER — Ambulatory Visit (INDEPENDENT_AMBULATORY_CARE_PROVIDER_SITE_OTHER): Payer: 59 | Admitting: Internal Medicine

## 2022-12-22 VITALS — BP 102/60 | HR 54 | Temp 96.8°F | Ht 65.0 in | Wt 151.0 lb

## 2022-12-22 DIAGNOSIS — R7309 Other abnormal glucose: Secondary | ICD-10-CM | POA: Diagnosis not present

## 2022-12-22 DIAGNOSIS — Z6825 Body mass index (BMI) 25.0-25.9, adult: Secondary | ICD-10-CM

## 2022-12-22 DIAGNOSIS — E663 Overweight: Secondary | ICD-10-CM

## 2022-12-22 DIAGNOSIS — Z0001 Encounter for general adult medical examination with abnormal findings: Secondary | ICD-10-CM | POA: Diagnosis not present

## 2022-12-22 DIAGNOSIS — K219 Gastro-esophageal reflux disease without esophagitis: Secondary | ICD-10-CM

## 2022-12-22 NOTE — Progress Notes (Signed)
Subjective:    Patient ID: Kim Oliver, female    DOB: 08-28-1991, 32 y.o.   MRN: [id number]  HPI  Patient presents to clinic today for her annual exam.  Flu: 09/2022 Tetanus: 04/2022 COVID: x 2 Pap smear: 11/2021 Dentist: biannually  Diet: She does eat meat. She consumes fruits and veggies. She does eat some fried foods. She drinks mostly water or juice. Exercise: None  Review of Systems  Past Medical History:  Diagnosis Date   Medical history non-contributory    Postpartum care following vaginal delivery 08/03/2022    Current Outpatient Medications  Medication Sig Dispense Refill   acetaminophen (TYLENOL) 500 MG tablet Take 2 tablets (1,000 mg total) by mouth every 6 (six) hours as needed. 100 tablet 2   Prenatal Vit-Fe Fumarate-FA (PRENATAL VITAMIN PO) Take by mouth.     methocarbamol (ROBAXIN) 500 MG tablet Take 0.5-1 tablets (250-500 mg total) by mouth every 8 (eight) hours as needed for muscle spasms. 15 tablet 0   No current facility-administered medications for this visit.    No Known Allergies  Family History  Problem Relation Age of Onset   Heart disease Paternal Grandfather    Diabetes Paternal Grandfather     Social History   Socioeconomic History   Marital status: Married    Spouse name: Not on file   Number of children: Not on file   Years of education: Not on file   Highest education level: Not on file  Occupational History   Occupation: Packing    Comment: Sales executive  Tobacco Use   Smoking status: Never   Smokeless tobacco: Not on file  Vaping Use   Vaping Use: Never used  Substance and Sexual Activity   Alcohol use: Never   Drug use: Never   Sexual activity: Yes  Other Topics Concern   Not on file  Social History Narrative   Not on file   Social Determinants of Health   Financial Resource Strain: Not on file  Food Insecurity: No Food Insecurity (07/16/2022)   Hunger Vital Sign    Worried About Running Out of Food in the  Last Year: Never true    Ran Out of Food in the Last Year: Never true  Transportation Needs: No Transportation Needs (07/16/2022)   PRAPARE - Hydrologist (Medical): No    Lack of Transportation (Non-Medical): No  Physical Activity: Not on file  Stress: Not on file  Social Connections: Not on file  Intimate Partner Violence: Not on file     Constitutional: Denies fever, malaise, fatigue, headache or abrupt weight changes.  HEENT: Denies eye pain, eye redness, ear pain, ringing in the ears, wax buildup, runny nose, nasal congestion, bloody nose, or sore throat. Respiratory: Denies difficulty breathing, shortness of breath, cough or sputum production.   Cardiovascular: Denies chest pain, chest tightness, palpitations or swelling in the hands or feet.  Gastrointestinal: Patient reports intermittent reflux.  Denies abdominal pain, bloating, constipation, diarrhea or blood in the stool.  GU: Patient reports dyspareunia.  Denies urgency, frequency, pain with urination, burning sensation, blood in urine, odor or discharge. Musculoskeletal: Denies decrease in range of motion, difficulty with gait, muscle pain or joint pain and swelling.  Skin: Denies redness, rashes, lesions or ulcercations.  Neurological: Denies dizziness, difficulty with memory, difficulty with speech or problems with balance and coordination.  Psych: Denies anxiety, depression, SI/HI.  No other specific complaints in a complete review of systems (except as listed  in HPI above).     Objective:   Physical Exam   BP 102/60 (BP Location: Left Arm, Patient Position: Sitting, Cuff Size: Normal)   Pulse (!) 54   Temp (!) 96.8 F (36 C) (Temporal)   Ht 5' 5"$  (1.651 m)   Wt 151 lb (68.5 kg)   SpO2 99%   Breastfeeding Yes   BMI 25.13 kg/m   Wt Readings from Last 3 Encounters:  12/22/22 151 lb (68.5 kg)  10/01/22 154 lb (69.9 kg)  09/22/22 150 lb (68 kg)    General: Appears her stated age,  overweight in NAD. Skin: Warm, dry and intact. No rashes, lesions or ulcerations noted. HEENT: Head: normal shape and size; Eyes: sclera white, no icterus, conjunctiva pink, PERRLA and EOMs intact;  Neck:  Neck supple, trachea midline. No masses, lumps or thyromegaly present.  Cardiovascular: Bradycardic with normal rhythm. S1,S2 noted.  No murmur, rubs or gallops noted. No JVD or BLE edema. No carotid bruits noted. Pulmonary/Chest: Normal effort and positive vesicular breath sounds. No respiratory distress. No wheezes, rales or ronchi noted.  Abdomen: Normal bowel sounds.  Musculoskeletal: Strength 5/5 BUE/BLE.  No difficulty with gait.  Neurological: Alert and oriented. Cranial nerves II-XII grossly intact. Coordination normal.  Psychiatric: Mood and affect normal. Behavior is normal. Judgment and thought content normal.     BMET    Component Value Date/Time   NA 132 (L) 12/22/2021 1502   K 3.5 12/22/2021 1502   CL 102 12/22/2021 1502   CO2 23 12/22/2021 1502   GLUCOSE 86 12/22/2021 1502   BUN 10 12/22/2021 1502   CREATININE 0.46 12/22/2021 1502   CALCIUM 9.1 12/22/2021 1502   GFRNONAA >60 12/22/2021 1502    Lipid Panel  No results found for: "CHOL", "TRIG", "HDL", "CHOLHDL", "VLDL", "LDLCALC"  CBC    Component Value Date/Time   WBC 14.0 (H) 07/17/2022 0539   RBC 3.28 (L) 07/17/2022 0539   HGB 11.5 (L) 07/17/2022 0539   HGB 12.5 04/16/2022 1115   HCT 32.7 (L) 07/17/2022 0539   HCT 36.0 04/16/2022 1115   PLT 147 (L) 07/17/2022 0539   PLT 266 04/16/2022 1115   MCV 99.7 07/17/2022 0539   MCV 100 (H) 04/16/2022 1115   MCH 35.1 (H) 07/17/2022 0539   MCHC 35.2 07/17/2022 0539   RDW 13.5 07/17/2022 0539   RDW 13.0 04/16/2022 1115   LYMPHSABS 1.4 04/16/2022 1115   MONOABS 0.7 12/22/2021 1502   EOSABS 0.1 04/16/2022 1115   BASOSABS 0.0 04/16/2022 1115    Hgb A1C No results found for: "HGBA1C"         Assessment & Plan:   Preventative Health  Maintenance:  Encouraged her to get a flu shot in the fall Tetanus UTD Encouraged her to get her COVID-vaccine Pap smear UTD Encouraged her to consume a balanced diet and exercise regimen Advised her to see an eye doctor and dentist annually We will check CBC, c-Met, lipid and A1c today  Dyspareunia:  Offered ultrasound pelvic/transvaginal for further evaluation but she declines at this time She reports she has a history of uterine fibroids and thinks this may be the cause She reports she will follow-up with GYN for this as needed.  RTC in 1 year, sooner if needed Webb Silversmith, NP

## 2022-12-22 NOTE — Assessment & Plan Note (Signed)
Encourage diet and exercise for weight loss 

## 2022-12-22 NOTE — Assessment & Plan Note (Signed)
Okay to take Tums OTC as needed

## 2022-12-22 NOTE — Patient Instructions (Signed)

## 2022-12-22 NOTE — Progress Notes (Deleted)
Annual Wellness Visit     Patient: Kim Oliver, Female    DOB: [birth year], 32 y.o.   MRN: [id number]  Subjective  No chief complaint on file.   Kim Oliver is a 32 y.o. female who presents today for her Annual Wellness Visit. She reports consuming a {diet types:17450} diet. {Exercise:19826} She generally feels {well/fairly well/poorly:18703}. She reports sleeping {well/fairly well/poorly:18703}. She {does/does not:200015} have additional problems to discuss today.   HPI  {VISON DENTAL STD PSA (Optional):27386}   {History (Optional):23778}  Medications: Outpatient Medications Prior to Visit  Medication Sig   acetaminophen (TYLENOL) 500 MG tablet Take 2 tablets (1,000 mg total) by mouth every 6 (six) hours as needed.   methocarbamol (ROBAXIN) 500 MG tablet Take 0.5-1 tablets (250-500 mg total) by mouth every 8 (eight) hours as needed for muscle spasms.   Prenatal Vit-Fe Fumarate-FA (PRENATAL VITAMIN PO) Take by mouth.   No facility-administered medications prior to visit.    No Known Allergies  Patient Care Team: Jearld Fenton, NP as PCP - General (Internal Medicine)  ROS      Objective  There were no vitals taken for this visit. {Vitals History (Optional):23777}  Physical Exam    Most recent functional status assessment:    07/16/2022    3:12 AM  In your present state of health, do you have any difficulty performing the following activities:  Hearing? 0  Vision? 0  Difficulty concentrating or making decisions? 0  Walking or climbing stairs? 0  Dressing or bathing? 0   Most recent fall risk assessment:    07/01/2021    2:15 PM  [place] in the past year? 0  Number falls in past yr: 0  Injury with Fall? 0  Risk for fall due to : No Fall Risks  Follow up Falls evaluation completed    Most recent depression screenings:    10/01/2022   11:26 AM 07/01/2021    2:14 PM  PHQ 2/9 Scores  PHQ - 2 Score 0 0  PHQ- 9 Score  2   Most  recent cognitive screening:     No data to display         Most recent Audit-C alcohol use screening     No data to display         A score of 3 or more in women, and 4 or more in men indicates increased risk for alcohol abuse, EXCEPT if all of the points are from question 1   Vision/Hearing Screen: No results found.  {Labs (Optional):23779}  No results found for any visits on 12/22/22.    Assessment & Plan   Annual wellness visit done today including the all of the following: Reviewed patient's Family Medical History Reviewed and updated list of patient's medical providers Assessment of cognitive impairment was done Assessed patient's functional ability Established a written schedule for health screening [place] Completed and Reviewed  Exercise Activities and Dietary recommendations  Goals   None     Immunization History  Administered Date(s) Administered   Influenza,inj,Quad PF,6+ Mos 10/01/2022   Tdap 11/16/2020, 04/16/2022    Health Maintenance  Topic Date Due   COVID-19 Vaccine (1) 01/07/2023 (Originally 04/27/1992)   PAP-Cervical Cytology Screening  12/01/2024   PAP SMEAR-Modifier  12/01/2024   DTaP/Tdap/Td (3 - Td or Tdap) 04/16/2032   INFLUENZA VACCINE  Completed   Hepatitis C Screening  Completed   HIV Screening  Completed   HPV VACCINES  Aged Out     Discussed health benefits of physical activity, and encouraged her to engage in regular exercise appropriate for her age and condition.    Problem List Items Addressed This Visit   None   No follow-ups on file.     Webb Silversmith, NP

## 2022-12-23 LAB — COMPLETE METABOLIC PANEL WITH GFR
AG Ratio: 1.4 (calc) (ref 1.0–2.5)
ALT: 13 U/L (ref 6–29)
AST: 14 U/L (ref 10–30)
Albumin: 4.6 g/dL (ref 3.6–5.1)
Alkaline phosphatase (APISO): 84 U/L (ref 31–125)
BUN: 15 mg/dL (ref 7–25)
CO2: 28 mmol/L (ref 20–32)
Calcium: 9.6 mg/dL (ref 8.6–10.2)
Chloride: 103 mmol/L (ref 98–110)
Creat: 0.6 mg/dL (ref 0.50–0.96)
Globulin: 3.2 g/dL (calc) (ref 1.9–3.7)
Glucose, Bld: 81 mg/dL (ref 65–99)
Potassium: 4.1 mmol/L (ref 3.5–5.3)
Sodium: 140 mmol/L (ref 135–146)
Total Bilirubin: 0.6 mg/dL (ref 0.2–1.2)
Total Protein: 7.8 g/dL (ref 6.1–8.1)
eGFR: 125 mL/min/{1.73_m2} (ref >=60)

## 2022-12-23 LAB — CBC
HCT: 42.1 % (ref 35.0–45.0)
Hemoglobin: 14.4 g/dL (ref 11.7–15.5)
MCH: 33.3 pg — ABNORMAL HIGH (ref 27.0–33.0)
MCHC: 34.2 g/dL (ref 32.0–36.0)
MCV: 97.5 fL (ref 80.0–100.0)
MPV: 10.1 fL (ref 7.5–12.5)
Platelets: 263 10*3/uL (ref 140–400)
RBC: 4.32 10*6/uL (ref 3.80–5.10)
RDW: 11.6 % (ref 11.0–15.0)
WBC: 5.5 10*3/uL (ref 3.8–10.8)

## 2022-12-23 LAB — HEMOGLOBIN A1C
Hgb A1c MFr Bld: 5.2 % of total Hgb (ref <5.7)
Mean Plasma Glucose: 103 mg/dL
eAG (mmol/L): 5.7 mmol/L

## 2022-12-23 LAB — LIPID PANEL
Cholesterol: 218 mg/dL — ABNORMAL HIGH (ref <200)
HDL: 62 mg/dL (ref >=50)
LDL Cholesterol (Calc): 139 mg/dL (calc) — ABNORMAL HIGH
Non-HDL Cholesterol (Calc): 156 mg/dL (calc) — ABNORMAL HIGH (ref <130)
Total CHOL/HDL Ratio: 3.5 (calc) (ref <5.0)
Triglycerides: 76 mg/dL (ref <150)

## 2023-11-03 NOTE — L&D Delivery Note (Signed)
 Delivery Note   Kim Oliver is a 33 y.o. H7E7997 at [redacted]w[redacted]d Estimated Date of Delivery: 07/15/24  PRE-OPERATIVE DIAGNOSIS:  1) [redacted]w[redacted]d pregnancy.    POST-OPERATIVE DIAGNOSIS:  1) [redacted]w[redacted]d pregnancy s/p Vaginal, Spontaneous 2) Precipitous labor with unintended, unattended home birth  Delivery Type: Vaginal, Spontaneous   Delivery Anesthesia: None  Labor Complications:  precipitous labor/birth    ESTIMATED BLOOD LOSS: 0 ml    FINDINGS:   Information for the patient's newborn:  Gracin, [place] [[id number]]  Live born female  Birth Weight: 8 lb 5 oz (3770 g) APGAR: ,   Newborn Delivery   Birth date/time: 07/19/2024 07:35:00 Delivery type: Vaginal, Spontaneous      SPECIMENS:   PLACENTA:   Appearance:  Intact, membranes shredded, marginal cord insertion   Removal:    spontaneous    Disposition: discarded  Cord Blood: not collected-placenta delivered at home, unable to collect, mother of baby O POS  DISPOSITION:  Infant left in stable condition in the delivery room, with L&D personnel and mother,  NARRATIVE SUMMARY: Labor course:  Kim Oliver is a H7E7997 at [redacted]w[redacted]d who presented to Labor & Delivery borught in by EMS after precipitous labor & birth at home. Home birth was unintended. She reports mild contractions overnight then significant increase in pain at 7am. She had a spontanesous urge to push, pushed twiced and delivered a viable female fetus at 735am. EMS was called. Placenta delivered at home. Reports infant with spontaneous cry. Denies large amount of bleeding at delivery. The placenta was inspected and was noted to be intact, membranes shredded, with a 3VC and marginal cord insertion. A perineal and vaginal examination was performed. Episiotomy/Lacerations:  None Mother and baby left in stable condition.   Harlene LITTIE Cisco, CNM 07/19/2024 9:53 PM

## 2023-11-04 ENCOUNTER — Ambulatory Visit: Payer: Self-pay

## 2023-11-04 NOTE — Telephone Encounter (Signed)
 Using [employer] [phone].  Patient called, left VM to return the call to the office to speak to the NT.    Summary: Possible blood in urine   Pt believes she has a possible UTI. Pt has noticed red color in urine, not sure it is blood. Pt states her urine color is not normal - brown like with hints of red. Pt is cramping as well and has back pain. Pt states she noticed sx about 5 days ago. Pt scheduled visit with PCP for 11/08/2022.  Pt seeking clinical advice, please call pt with Spanish Interpreter

## 2023-11-04 NOTE — Telephone Encounter (Signed)
  Chief Complaint: hematuria Symptoms: brown colored urine with hints of red, low back pain Frequency: 5 days  Pertinent Negatives: Patient denies urgency, frequency, pain with urination Disposition: [] ED /[] Urgent Care (no appt availability in office) / [x] Appointment(In office/virtual)/ []  [facility]/ [] Home Care/ [] Refused Recommended Disposition /[] [facility]/ []  Follow-up with PCP Additional Notes: pt reported above sx, has taken some Tylenol  and helped with back pain. Appt scheduled for 11/09/23, added to wait list and advised pt she can go to UC prior to appt for care, pt preferred to wait for OV. Care advice given and pt verbalized understanding.   Summary: Possible blood in urine   Pt believes she has a possible UTI. Pt has noticed red color in urine, not sure it is blood. Pt states her urine color is not normal - brown like with hints of red. Pt is cramping as well and has back pain. Pt states she noticed sx about 5 days ago. Pt scheduled visit with PCP for 11/08/2022.  Pt seeking clinical advice, please call pt with Spanish Interpreter     Reason for Disposition  Side (flank) or lower back pain present  Answer Assessment - Initial Assessment Questions 1. SYMPTOM: What's the main symptom you're concerned about? (e.g., frequency, incontinence)     Blood urine  2. ONSET: When did the  sx  start?     5 days  3. PAIN: Is there any pain? If Yes, ask: How bad is it? (Scale: 1-10; mild, moderate, severe)     Not really  4. CAUSE: What do you think is causing the symptoms?     UTI  5. OTHER SYMPTOMS: Do you have any other symptoms? (e.g., blood in urine, fever, flank pain, pain with urination)     no 6. PREGNANCY: Is there any chance you are pregnant? When was your last menstrual period?     10/06/23  Protocols used: Urinary Symptoms-A-AH

## 2023-11-09 ENCOUNTER — Ambulatory Visit: Payer: Self-pay | Admitting: Internal Medicine

## 2023-11-09 ENCOUNTER — Encounter: Payer: Self-pay | Admitting: Internal Medicine

## 2023-11-09 VITALS — BP 112/68 | Ht 65.0 in | Wt 148.6 lb

## 2023-11-09 DIAGNOSIS — R310 Gross hematuria: Secondary | ICD-10-CM | POA: Diagnosis not present

## 2023-11-09 DIAGNOSIS — R82998 Other abnormal findings in urine: Secondary | ICD-10-CM | POA: Diagnosis not present

## 2023-11-09 LAB — POCT URINALYSIS DIPSTICK
Bilirubin, UA: NEGATIVE
Glucose, UA: NEGATIVE
Ketones, UA: NEGATIVE
Leukocytes, UA: NEGATIVE
Nitrite, UA: NEGATIVE
Odor: POSITIVE
Protein, UA: NEGATIVE
Spec Grav, UA: 1.015 (ref 1.010–1.025)
Urobilinogen, UA: 0.2 U/dL
pH, UA: 6 (ref 5.0–8.0)

## 2023-11-09 LAB — POCT URINE PREGNANCY: Preg Test, Ur: NEGATIVE

## 2023-11-09 NOTE — Patient Instructions (Signed)

## 2023-11-09 NOTE — Progress Notes (Signed)
 HPI  Discussed the use of AI scribe software for clinical note transcription with the patient, who gave verbal consent to proceed.  History of Present Illness   The patient has been experiencing dark colored urine since May. The urine color varies from dark brown to pink, and in the last few days, it has been completely red. The patient denies dysuria but reports low back pain and cramping, similar to menstrual cramps. She also reports headaches but denies fever, chills, nausea, and vomiting. The patient's last menstrual period was on December 10th, and there is a possibility of pregnancy, although she is not currently on any birth control. The patient has no personal history of kidney stones, but there is a family history of the same in her mother.     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[employer]  Tobacco Use   Smoking status: Never   Smokeless tobacco: Not on file  Vaping Use   Vaping status: Never Used  Substance and Sexual Activity   Alcohol use: Never   Drug use: Never   Sexual activity: Yes  Other Topics Concern   Not on file  Social History Narrative   Not on file   Social Drivers of Health   Financial Resource Strain: Not on file  Food Insecurity: No Food Insecurity (07/16/2022)   Hunger Vital Sign    Worried About Running Out of Food in the Last Year: Never true    Ran Out of Food in the Last Year: Never true  Transportation Needs: No Transportation Needs (07/16/2022)    PRAPARE - Administrator, Civil Service (Medical): No    Lack of Transportation (Non-Medical): No  Physical Activity: Not on file  Stress: Not on file  Social Connections: Not on file  Intimate Partner Violence: Not on file    No Known Allergies   Constitutional: Denies fever, malaise, fatigue, headache or abrupt weight changes.   GU: Pt reports blood in her urine. Denies urgency, frequency, dysuria, burning sensation, blood in urine, odor or discharge. Skin: Denies redness, rashes, lesions or ulcercations.   No other specific complaints in a complete review of systems (except as listed in HPI above).    Objective:   Physical Exam  BP 112/68 (BP Location: Left Arm, Patient Position: Sitting, Cuff Size: Normal)   Ht 5' 5 (1.651 m)   Wt 148 lb 9.6 oz (67.4 kg)   Breastfeeding Yes   BMI 24.73 kg/m   Wt Readings from Last 3 Encounters:  12/22/22 151 lb (68.5 kg)  10/01/22 154 lb (69.9 kg)  09/22/22 150 lb (68 kg)    General: Appears her stated age, well developed, well nourished in NAD. Cardiovascular: Normal rate and rhythm. S1,S2 noted.   Pulmonary/Chest: Normal effort and positive vesicular breath sounds. No respiratory distress. No wheezes, rales or ronchi noted.  Abdomen: Soft and nontender. Normal bowel sounds. No distention or masses noted.  No CVA tenderness.        Assessment & Plan:   Assessment and Plan    Hematuria Persistent dark colored urine since May with occasional frank blood. No dysuria, frequency, or flank pain. Urinalysis showed blood but no signs of infection. No history of kidney stones. Negative pregnancy test. -Refer to Urology for further evaluation. -Order blood work to check kidney function. -Send urine for culture to rule out infection.   RTC in 1 month for your annual exam Angeline Laura, NP

## 2023-11-10 LAB — URINE CULTURE
MICRO NUMBER:: 15927900
Result:: NO GROWTH
SPECIMEN QUALITY:: ADEQUATE

## 2023-12-02 ENCOUNTER — Encounter: Payer: Self-pay | Admitting: Internal Medicine

## 2023-12-02 ENCOUNTER — Ambulatory Visit (INDEPENDENT_AMBULATORY_CARE_PROVIDER_SITE_OTHER): Payer: Self-pay | Admitting: Internal Medicine

## 2023-12-02 VITALS — BP 108/60 | Ht 65.0 in | Wt 149.0 lb

## 2023-12-02 DIAGNOSIS — Z3201 Encounter for pregnancy test, result positive: Secondary | ICD-10-CM

## 2023-12-02 DIAGNOSIS — N926 Irregular menstruation, unspecified: Secondary | ICD-10-CM | POA: Diagnosis not present

## 2023-12-02 LAB — POCT URINE PREGNANCY: Preg Test, Ur: POSITIVE — AB

## 2023-12-02 NOTE — Patient Instructions (Signed)

## 2023-12-02 NOTE — Progress Notes (Signed)
Subjective:    Patient ID: Kim Oliver, female    DOB: 1991/08/20, 33 y.o.   MRN: [id number]  HPI  Discussed the use of AI scribe software for clinical note transcription with the patient, who gave verbal consent to proceed.  The patient presents with a positive home pregnancy test for confirmation and prenatal care initiation.  She was actively trying to conceive and missed her menstrual cycle, which was due in early January. She is uncertain whether her last menstrual period was on December 7th or 10th.  No fatigue, breast tenderness, nausea, or pelvic pain. However, she notes increased urinary frequency and symptoms of acid reflux, which she also experienced during her previous pregnancy.  This is her second pregnancy, and she is currently breastfeeding her 44-month-old child. She finds it challenging to wean her child off breastfeeding.       Review of Systems   Past Medical History:  Diagnosis Date   Medical history non-contributory    Postpartum care following vaginal delivery 08/03/2022    Current Outpatient Medications  Medication Sig Dispense Refill   Prenatal Vit-Fe Fumarate-FA (PRENATAL VITAMIN PO) Take by mouth.     No current facility-administered medications for this visit.    No Known Allergies  Family History  Problem Relation Age of Onset   Heart disease Paternal Grandfather    Diabetes Paternal Grandfather     Social History   Socioeconomic History   Marital status: Married    Spouse name: Not on file   Number of children: Not on file   Years of education: Not on file   Highest education level: Not on file  Occupational History   Occupation: Packing    Comment: Education officer, [employer]  Tobacco Use   Smoking status: Never   Smokeless tobacco: Not on file  Vaping Use   Vaping status: Never Used  Substance and Sexual Activity   Alcohol use: Never   Drug use: Never   Sexual activity: Yes  Other Topics Concern   Not on file  Social History  Narrative   Not on file   Social Drivers of Health   Financial Resource Strain: Not on file  Food Insecurity: No Food Insecurity (07/16/2022)   Hunger Vital Sign    Worried About Running Out of Food in the Last Year: Never true    Ran Out of Food in the Last Year: Never true  Transportation Needs: No Transportation Needs (07/16/2022)   PRAPARE - Administrator, Civil Service (Medical): No    Lack of Transportation (Non-Medical): No  Physical Activity: Not on file  Stress: Not on file  Social Connections: Not on file  Intimate Partner Violence: Not on file     Constitutional: Denies fever, malaise, fatigue, headache or abrupt weight changes.  HEENT: Denies eye pain, eye redness, ear pain, ringing in the ears, wax buildup, runny nose, nasal congestion, bloody nose, or sore throat. Respiratory: Denies difficulty breathing, shortness of breath, cough or sputum production.   Cardiovascular: Denies chest pain, chest tightness, palpitations or swelling in the hands or feet.  Gastrointestinal: Pt reports reflux. Denies abdominal pain, bloating, constipation, diarrhea or blood in the stool.  GU: Pt reports urinary frequency. Denies urgency, pain with urination, burning sensation, blood in urine, odor or discharge. Musculoskeletal: Denies decrease in range of motion, difficulty with gait, muscle pain or joint pain and swelling.  Skin: Denies redness, rashes, lesions or ulcercations.  Neurological: Denies dizziness, difficulty with memory, difficulty with speech or  problems with balance and coordination.  Psych: Denies anxiety, depression, SI/HI.  No other specific complaints in a complete review of systems (except as listed in HPI above).      Objective:   Physical Exam  BP 108/60 (BP Location: Left Arm, Patient Position: Sitting, Cuff Size: Normal)   Ht 5\' 5"  (1.651 m)   Wt 149 lb (67.6 kg)   LMP  (LMP Unknown)   BMI 24.79 kg/m   Wt Readings from Last 3 Encounters:   11/09/23 148 lb 9.6 oz (67.4 kg)  12/22/22 151 lb (68.5 kg)  10/01/22 154 lb (69.9 kg)    General: Appears her stated age, well developed, well nourished in NAD. Cardiovascular: Normal rate and rhythm.  Pulmonary/Chest: Normal effort and positive vesicular breath sounds. No respiratory distress.  Neurological: Alert and oriented.   BMET    Component Value Date/Time   NA 140 12/22/2022 0853   K 4.1 12/22/2022 0853   CL 103 12/22/2022 0853   CO2 28 12/22/2022 0853   GLUCOSE 81 12/22/2022 0853   BUN 15 12/22/2022 0853   CREATININE 0.60 12/22/2022 0853   CALCIUM 9.6 12/22/2022 0853   GFRNONAA >60 12/22/2021 1502    Lipid Panel     Component Value Date/Time   CHOL 218 (H) 12/22/2022 0853   TRIG 76 12/22/2022 0853   HDL 62 12/22/2022 0853   CHOLHDL 3.5 12/22/2022 0853   LDLCALC 139 (H) 12/22/2022 0853    CBC    Component Value Date/Time   WBC 5.5 12/22/2022 0853   RBC 4.32 12/22/2022 0853   HGB 14.4 12/22/2022 0853   HGB 12.5 04/16/2022 1115   HCT 42.1 12/22/2022 0853   HCT 36.0 04/16/2022 1115   PLT 263 12/22/2022 0853   PLT 266 04/16/2022 1115   MCV 97.5 12/22/2022 0853   MCV 100 (H) 04/16/2022 1115   MCH 33.3 (H) 12/22/2022 0853   MCHC 34.2 12/22/2022 0853   RDW 11.6 12/22/2022 0853   RDW 13.0 04/16/2022 1115   LYMPHSABS 1.4 04/16/2022 1115   MONOABS 0.7 12/22/2021 1502   EOSABS 0.1 04/16/2022 1115   BASOSABS 0.0 04/16/2022 1115    Hgb A1C Lab Results  Component Value Date   HGBA1C 5.2 12/22/2022            Assessment & Plan:    Assessment and Plan    Early Pregnancy Positive home pregnancy test confirmed with urine pregnancy test in office. Last menstrual period approximately December 7-10. No symptoms of pregnancy except increased urination and acid reflux, similar to previous pregnancy. -Start prenatal vitamins daily. -Avoid ibuprofen and certain cold medicines. -Contact OB/GYN for initial prenatal visit.  Breastfeeding while  pregnant Currently breastfeeding a 33-month-old child. Uncertain about continuing breastfeeding during pregnancy. -Discuss with OB/GYN at initial prenatal visit.       RTC in 1 month for your annual exam Nicki Reaper, NP

## 2023-12-06 ENCOUNTER — Other Ambulatory Visit: Payer: Self-pay

## 2023-12-06 DIAGNOSIS — Z3687 Encounter for antenatal screening for uncertain dates: Secondary | ICD-10-CM

## 2023-12-11 ENCOUNTER — Emergency Department
Admission: EM | Admit: 2023-12-11 | Discharge: 2023-12-12 | Disposition: A | Discharge status: Home / Self Care | Payer: BC Managed Care – PPO | Attending: Emergency Medicine | Admitting: Emergency Medicine

## 2023-12-11 ENCOUNTER — Other Ambulatory Visit: Payer: Self-pay

## 2023-12-11 DIAGNOSIS — O26891 Other specified pregnancy related conditions, first trimester: Secondary | ICD-10-CM | POA: Diagnosis not present

## 2023-12-11 DIAGNOSIS — D72829 Elevated white blood cell count, unspecified: Secondary | ICD-10-CM | POA: Insufficient documentation

## 2023-12-11 DIAGNOSIS — Z3A08 8 weeks gestation of pregnancy: Secondary | ICD-10-CM

## 2023-12-11 DIAGNOSIS — R109 Unspecified abdominal pain: Secondary | ICD-10-CM | POA: Diagnosis not present

## 2023-12-11 DIAGNOSIS — D259 Leiomyoma of uterus, unspecified: Secondary | ICD-10-CM | POA: Diagnosis not present

## 2023-12-11 DIAGNOSIS — Z3A01 Less than 8 weeks gestation of pregnancy: Secondary | ICD-10-CM | POA: Diagnosis not present

## 2023-12-11 DIAGNOSIS — R1031 Right lower quadrant pain: Secondary | ICD-10-CM | POA: Diagnosis not present

## 2023-12-11 NOTE — ED Triage Notes (Signed)
 Pt c/o right flank pain radiating to lower abdomen & vomiting that started tonight around 19:00 and is getting worse. Pt reports she is 8-[redacted] weeks pregnant. Pt denies vaginal bleeding or discharge.

## 2023-12-12 ENCOUNTER — Encounter: Payer: Self-pay | Admitting: Emergency Medicine

## 2023-12-12 ENCOUNTER — Emergency Department: Payer: BC Managed Care – PPO

## 2023-12-12 DIAGNOSIS — O26891 Other specified pregnancy related conditions, first trimester: Secondary | ICD-10-CM | POA: Diagnosis not present

## 2023-12-12 DIAGNOSIS — D259 Leiomyoma of uterus, unspecified: Secondary | ICD-10-CM | POA: Diagnosis not present

## 2023-12-12 DIAGNOSIS — Z3A08 8 weeks gestation of pregnancy: Secondary | ICD-10-CM | POA: Diagnosis not present

## 2023-12-12 LAB — URINALYSIS, ROUTINE W REFLEX MICROSCOPIC
Bacteria, UA: NONE SEEN
Bilirubin Urine: NEGATIVE
Glucose, UA: NEGATIVE mg/dL
Ketones, ur: 20 mg/dL — AB
Leukocytes,Ua: NEGATIVE
Nitrite: NEGATIVE
Protein, ur: NEGATIVE mg/dL
Specific Gravity, Urine: 1.024 (ref 1.005–1.030)
pH: 6 (ref 5.0–8.0)

## 2023-12-12 LAB — COMPREHENSIVE METABOLIC PANEL
ALT: 16 U/L (ref 0–44)
AST: 17 U/L (ref 15–41)
Albumin: 4.1 g/dL (ref 3.5–5.0)
Alkaline Phosphatase: 44 U/L (ref 38–126)
Anion gap: 13 (ref 5–15)
BUN: 19 mg/dL (ref 6–20)
CO2: 22 mmol/L (ref 22–32)
Calcium: 9.1 mg/dL (ref 8.9–10.3)
Chloride: 99 mmol/L (ref 98–111)
Creatinine, Ser: 0.51 mg/dL (ref 0.44–1.00)
GFR, Estimated: >60 mL/min (ref >60)
Glucose, Bld: 123 mg/dL — ABNORMAL HIGH (ref 70–99)
Potassium: 3.6 mmol/L (ref 3.5–5.1)
Sodium: 134 mmol/L — ABNORMAL LOW (ref 135–145)
Total Bilirubin: 0.5 mg/dL (ref 0.0–1.2)
Total Protein: 7.6 g/dL (ref 6.5–8.1)

## 2023-12-12 LAB — CBC WITH DIFFERENTIAL/PLATELET
Abs Immature Granulocytes: 0.03 10*3/uL (ref 0.00–0.07)
Basophils Absolute: 0 10*3/uL (ref 0.0–0.1)
Basophils Relative: 0 %
Eosinophils Absolute: 0.1 10*3/uL (ref 0.0–0.5)
Eosinophils Relative: 1 %
HCT: 36.5 % (ref 36.0–46.0)
Hemoglobin: 12.5 g/dL (ref 12.0–15.0)
Immature Granulocytes: 0 %
Lymphocytes Relative: 11 %
Lymphs Abs: 1.3 10*3/uL (ref 0.7–4.0)
MCH: 33.3 pg (ref 26.0–34.0)
MCHC: 34.2 g/dL (ref 30.0–36.0)
MCV: 97.3 fL (ref 80.0–100.0)
Monocytes Absolute: 0.4 10*3/uL (ref 0.1–1.0)
Monocytes Relative: 3 %
Neutro Abs: 10 10*3/uL — ABNORMAL HIGH (ref 1.7–7.7)
Neutrophils Relative %: 85 %
Platelets: 298 10*3/uL (ref 150–400)
RBC: 3.75 MIL/uL — ABNORMAL LOW (ref 3.87–5.11)
RDW: 13 % (ref 11.5–15.5)
WBC: 11.8 10*3/uL — ABNORMAL HIGH (ref 4.0–10.5)
nRBC: 0 % (ref 0.0–0.2)

## 2023-12-12 LAB — LIPASE, BLOOD: Lipase: 33 U/L (ref 11–51)

## 2023-12-12 LAB — HCG, QUANTITATIVE, PREGNANCY: hCG, Beta Chain, Quant, S: 88875 m[IU]/mL — ABNORMAL HIGH (ref <5)

## 2023-12-12 MED ORDER — MORPHINE SULFATE (PF) 2 MG/ML IV SOLN
2.0000 mg | Freq: Once | INTRAVENOUS | Status: AC
  Administered 2023-12-12: 2 mg via INTRAVENOUS
  Filled 2023-12-12: qty 1

## 2023-12-12 NOTE — ED Provider Notes (Signed)
 [facility] Provider Note    Event Date/Time   First MD Initiated Contact with Patient 12/11/23 2345     (approximate)   History   Flank Pain   HPI  Kim Oliver is a 33 y.o. female  who presents to the emergency deparmtent today because of concern for right lower quadrant and right flank pain. Pain started today. She is 8-[redacted] weeks pregnant. Has not had an ultrasound yet this pregnancy. Denies any vaginal bleeding or discharge. No urinary symptoms.  Had similar pain with previous pregnancy.      Physical Exam   Triage Vital Signs: ED Triage Vitals  Encounter Vitals Group     BP 12/11/23 2344 99/60     Systolic BP Percentile --      Diastolic BP Percentile --      Pulse Rate 12/11/23 2344 65     Resp 12/11/23 2344 18     Temp 12/11/23 2344 97.9 F (36.6 C)     Temp Source 12/11/23 2344 Oral     SpO2 12/11/23 2344 98 %     Weight 12/11/23 2342 140 lb (63.5 kg)     Height --      Head Circumference --      Peak Flow --      Pain Score 12/11/23 2342 10     Pain Loc --      Pain Education --      Exclude from Growth Chart --     Most recent vital signs: Vitals:   12/11/23 2344  BP: 99/60  Pulse: 65  Resp: 18  Temp: 97.9 F (36.6 C)  SpO2: 98%   General: Awake, alert, oriented. CV:  Good peripheral perfusion. Regular rate and rhythm. Resp:  Normal effort. Lungs clear. Abd:  No distention. Non tender to palpation. Soft.   ED Results / Procedures / Treatments   Labs (all labs ordered are listed, but only abnormal results are displayed) Labs Reviewed  CBC WITH DIFFERENTIAL/PLATELET  COMPREHENSIVE METABOLIC PANEL  LIPASE, BLOOD  URINALYSIS, ROUTINE W REFLEX MICROSCOPIC     EKG  None   RADIOLOGY I independently interpreted and visualized the US  Ob. My interpretation: intrauterine pregnancy Radiology interpretation:  IMPRESSION:  1. Single, viable intrauterine pregnancy at approximately 8 weeks  and 5 days gestation by  ultrasound evaluation.      PROCEDURES:  Critical Care performed: No    MEDICATIONS ORDERED IN ED: Medications - No data to display   IMPRESSION / MDM / ASSESSMENT AND PLAN / ED COURSE  I reviewed the triage vital signs and the nursing notes.                              Differential diagnosis includes, but is not limited to, appendicitis, ovarian cyst, ectopic pregnancy  Patient's presentation is most consistent with acute presentation with potential threat to life or bodily function.   Patient presented to the emergency department today because of concerns for right sided abdominal pain in the setting of early pregnancy.  Patient had not yet received an ultrasound during this pregnancy.  On exam patient without fever.  Abdomen is nontender.  Ultrasound was performed which shows an intrauterine pregnancy.  Blood work with very mild leukocytosis.  Patient did feel better here in the emergency department.  At this time I have low suspicion for appendicitis.  She had similar pain in her previous pregnancy and had a  negative MRI at that time.  Given patient's improvement I do think it would be reasonable to discharge at this time.  Did discuss return precautions and advised patient to follow-up with her OB/GYN.   FINAL CLINICAL IMPRESSION(S) / ED DIAGNOSES   Final diagnoses:  Right sided abdominal pain  [redacted] weeks gestation of pregnancy       Note:  This document was prepared using Dragon voice recognition software and may include unintentional dictation errors.    Floy Roberts, MD 12/12/23 [phone]

## 2023-12-13 ENCOUNTER — Ambulatory Visit (INDEPENDENT_AMBULATORY_CARE_PROVIDER_SITE_OTHER): Payer: BC Managed Care – PPO

## 2023-12-13 ENCOUNTER — Other Ambulatory Visit (HOSPITAL_COMMUNITY)
Admission: RE | Admit: 2023-12-13 | Discharge: 2023-12-13 | Disposition: A | Discharge status: Home / Self Care | Payer: BC Managed Care – PPO | Source: Ambulatory Visit | Attending: Obstetrics | Admitting: Obstetrics

## 2023-12-13 VITALS — BP 116/48 | HR 59 | Ht 59.0 in | Wt 153.2 lb

## 2023-12-13 DIAGNOSIS — Z3A09 9 weeks gestation of pregnancy: Secondary | ICD-10-CM

## 2023-12-13 DIAGNOSIS — Z113 Encounter for screening for infections with a predominantly sexual mode of transmission: Secondary | ICD-10-CM

## 2023-12-13 DIAGNOSIS — Z1379 Encounter for other screening for genetic and chromosomal anomalies: Secondary | ICD-10-CM

## 2023-12-13 DIAGNOSIS — Z114 Encounter for screening for human immunodeficiency virus [HIV]: Secondary | ICD-10-CM

## 2023-12-13 DIAGNOSIS — Z3689 Encounter for other specified antenatal screening: Secondary | ICD-10-CM

## 2023-12-13 DIAGNOSIS — Z1159 Encounter for screening for other viral diseases: Secondary | ICD-10-CM | POA: Diagnosis not present

## 2023-12-13 DIAGNOSIS — Z3400 Encounter for supervision of normal first pregnancy, unspecified trimester: Secondary | ICD-10-CM | POA: Diagnosis not present

## 2023-12-13 DIAGNOSIS — Z3491 Encounter for supervision of normal pregnancy, unspecified, first trimester: Secondary | ICD-10-CM | POA: Diagnosis not present

## 2023-12-13 LAB — OB RESULTS CONSOLE VARICELLA ZOSTER ANTIBODY, IGG: Varicella: IMMUNE

## 2023-12-13 MED ORDER — PRENATAL GUMMIES/DHA & FA 0.4-32.5 MG PO CHEW: 1.0000 | CHEWABLE_TABLET | Freq: Every day | ORAL | 2 refills | Status: AC

## 2023-12-13 NOTE — Progress Notes (Signed)
 New OB Intake I explained I am completing New OB Intake today in person. We discussed her EDD of 07/15/2024 that is based on LMP of 10/09/2023. Pt is G2/P1001. I reviewed her allergies, medications, Medical/Surgical/OB history, and appropriate screenings. There are no cats in the home.  Based on history, this is an uncomplicated pregnancy. She has no significant obstetrical history,  except for she had to be induced because she went past her due date. Induced at 41 weeks.  She has been having some headaches on and off.  Patient Active Problem List   Diagnosis Date Noted   Gastroesophageal reflux disease without esophagitis 02/24/2022   Overweight with body mass index (BMI) of 25 to 25.9 in adult 07/01/2021    Concerns addressed today Headaches on and off.  Delivery Plans:  Plans to deliver at [facility]  Anatomy US Explained first scheduled [place] will be around 19 weeks. Anatomy US scheduled for 20 at weeks. Pt notified to arrive at 15 minutes.  Labs Discussed genetic screening with patient. Patient consented to genetic testing to be drawn.  Lab Orders         Culture, OB Urine         NOB Panel         Hgb Fractionation Cascade         PANORAMA PRENATAL TEST         Urinalysis, Routine w reflex microscopic      COVID Vaccine Patient has had COVID vaccine.   Social Determinants of Health Food Insecurity: denies food insecurity WIC Referral: Patient has WIC.  Transportation: Patient denies transportation needs. Childcare: Discussed no children allowed at ultrasound appointments.   First visit review I reviewed new OB appt with pt. I explained she will not have blood work since it was done today. She will have pelvic exam but no pap smear it is UTD. Pink folder was given to patient and explained at NOB Intake. Explained that labs will be reviewed and she will be seen by Hartley Barefoot, CNM at first visit; encounter routed to appropriate provider.    Tommie Raymond, [facility] 12/13/2023  11:34 AM [facility]

## 2023-12-14 ENCOUNTER — Ambulatory Visit: Payer: Self-pay | Admitting: Urology

## 2023-12-14 LAB — CERVICOVAGINAL ANCILLARY ONLY
Chlamydia: NEGATIVE
Comment: NEGATIVE
Comment: NORMAL
Neisseria Gonorrhea: NEGATIVE

## 2023-12-14 LAB — MICROSCOPIC EXAMINATION
Casts: NONE SEEN /[LPF]
Epithelial Cells (non renal): >10 /[HPF] — AB (ref 0–10)

## 2023-12-14 LAB — URINALYSIS, ROUTINE W REFLEX MICROSCOPIC
Bilirubin, UA: NEGATIVE
Glucose, UA: NEGATIVE
Ketones, UA: NEGATIVE
Nitrite, UA: NEGATIVE
Protein,UA: NEGATIVE
RBC, UA: NEGATIVE
Specific Gravity, UA: 1.014 (ref 1.005–1.030)
Urobilinogen, Ur: 0.2 mg/dL (ref 0.2–1.0)
pH, UA: 6.5 (ref 5.0–7.5)

## 2023-12-15 LAB — CBC/D/PLT+RPR+RH+ABO+RUBIGG...
Antibody Screen: NEGATIVE
Basophils Absolute: 0 10*3/uL (ref 0.0–0.2)
Basos: 0 %
EOS (ABSOLUTE): 0.1 10*3/uL (ref 0.0–0.4)
Eos: 1 %
HCV Ab: NONREACTIVE
HIV Screen 4th Generation wRfx: NONREACTIVE
Hematocrit: 39 % (ref 34.0–46.6)
Hemoglobin: 12.9 g/dL (ref 11.1–15.9)
Hepatitis B Surface Ag: NEGATIVE
Immature Grans (Abs): 0 10*3/uL (ref 0.0–0.1)
Immature Granulocytes: 0 %
Lymphocytes Absolute: 2.1 10*3/uL (ref 0.7–3.1)
Lymphs: 21 %
MCH: 32.7 pg (ref 26.6–33.0)
MCHC: 33.1 g/dL (ref 31.5–35.7)
MCV: 99 fL — ABNORMAL HIGH (ref 79–97)
Monocytes Absolute: 0.5 10*3/uL (ref 0.1–0.9)
Monocytes: 5 %
Neutrophils Absolute: 7 10*3/uL (ref 1.4–7.0)
Neutrophils: 73 %
Platelets: 320 10*3/uL (ref 150–450)
RBC: 3.95 x10E6/uL (ref 3.77–5.28)
RDW: 12.3 % (ref 11.7–15.4)
RPR Ser Ql: NONREACTIVE
Rh Factor: POSITIVE
Rubella Antibodies, IGG: 2.85 {index} (ref >0.99)
Varicella zoster IgG: REACTIVE
WBC: 9.8 10*3/uL (ref 3.4–10.8)

## 2023-12-15 LAB — CULTURE, OB URINE

## 2023-12-15 LAB — HGB FRACTIONATION CASCADE
Hgb A2: 2.6 % (ref 1.8–3.2)
Hgb A: 97.4 % (ref 96.4–98.8)
Hgb F: 0 % (ref 0.0–2.0)
Hgb S: 0 %

## 2023-12-15 LAB — URINE CULTURE, OB REFLEX

## 2023-12-15 LAB — HCV INTERPRETATION

## 2023-12-16 ENCOUNTER — Ambulatory Visit: Payer: BC Managed Care – PPO

## 2023-12-19 LAB — PANORAMA PRENATAL TEST FULL PANEL:PANORAMA TEST PLUS 5 ADDITIONAL MICRODELETIONS: FETAL FRACTION: 6.8

## 2023-12-23 ENCOUNTER — Encounter: Payer: Self-pay | Admitting: Certified Nurse Midwife

## 2023-12-24 ENCOUNTER — Ambulatory Visit: Payer: BC Managed Care – PPO | Admitting: Internal Medicine

## 2023-12-24 ENCOUNTER — Encounter: Payer: Self-pay | Admitting: Internal Medicine

## 2023-12-24 VITALS — BP 102/52 | Ht 59.0 in | Wt 152.4 lb

## 2023-12-24 DIAGNOSIS — Z0001 Encounter for general adult medical examination with abnormal findings: Secondary | ICD-10-CM | POA: Diagnosis not present

## 2023-12-24 NOTE — Progress Notes (Signed)
 Subjective:    Patient ID: Kim Oliver, female    DOB: 02-16-1991, 33 y.o.   MRN: [id number]  HPI  Patient presents to clinic today for her annual exam.  Flu: 09/2022 Tetanus: 04/2022 COVID: x 2 Pap smear: 11/2021 Dentist: biannually  Diet: She does eat meat. She consumes fruits and veggies. She does eat some fried foods. She drinks mostly water or juice. Exercise: None  Review of Systems  Past Medical History:  Diagnosis Date   Medical history non-contributory    Postpartum care following vaginal delivery 08/03/2022    Current Outpatient Medications  Medication Sig Dispense Refill   Prenatal MV-Min-FA-Omega-3 (PRENATAL GUMMIES/DHA & FA) 0.4-32.5 MG CHEW Chew 1 each by mouth daily. 90 tablet 2   No current facility-administered medications for this visit.    No Known Allergies  Family History  Problem Relation Age of Onset   COPD Maternal Grandfather    Cancer Maternal Grandfather    Alcohol abuse Paternal Grandfather    Dementia Paternal Grandfather    Breast cancer Neg Hx    Colon cancer Neg Hx    Cervical cancer Neg Hx     Social History   Socioeconomic History   Marital status: Married    Spouse name: Caryn Bee   Number of children: 1   Years of education: Not on file   Highest education level: Some college, no degree  Occupational History   Occupation: Packing    Comment: Education officer, museum  Tobacco Use   Smoking status: Never   Smokeless tobacco: Never  Vaping Use   Vaping status: Never Used  Substance and Sexual Activity   Alcohol use: Never   Drug use: Never   Sexual activity: Yes  Other Topics Concern   Not on file  Social History Narrative   Not on file   Social Drivers of Health   Financial Resource Strain: Not on file  Food Insecurity: No Food Insecurity (07/16/2022)   Hunger Vital Sign    Worried About Running Out of Food in the Last Year: Never true    Ran Out of Food in the Last Year: Never true  Transportation Needs: No  Transportation Needs (07/16/2022)   PRAPARE - Administrator, Civil Service (Medical): No    Lack of Transportation (Non-Medical): No  Physical Activity: Not on file  Stress: No Stress Concern Present (12/13/2023)   [employer] of Occupational Health - Occupational Stress Questionnaire    Feeling of Stress : Not at all  Social Connections: Not on file  Intimate Partner Violence: Not At Risk (12/13/2023)   Humiliation, Afraid, Rape, and Kick questionnaire    Fear of Current or Ex-Partner: No    Emotionally Abused: No    Physically Abused: No    Sexually Abused: No     Constitutional: Denies fever, malaise, fatigue, headache or abrupt weight changes.  HEENT: Denies eye pain, eye redness, ear pain, ringing in the ears, wax buildup, runny nose, nasal congestion, bloody nose, or sore throat. Respiratory: Denies difficulty breathing, shortness of breath, cough or sputum production.   Cardiovascular: Denies chest pain, chest tightness, palpitations or swelling in the hands or feet.  Gastrointestinal: Patient reports intermittent reflux.  Denies abdominal pain, bloating, constipation, diarrhea or blood in the stool.  GU: Patient reports dyspareunia.  Denies urgency, frequency, pain with urination, burning sensation, blood in urine, odor or discharge. Musculoskeletal: Denies decrease in range of motion, difficulty with gait, muscle pain or joint pain and swelling.  Skin: Denies redness, rashes, lesions or ulcercations.  Neurological: Denies dizziness, difficulty with memory, difficulty with speech or problems with balance and coordination.  Psych: Denies anxiety, depression, SI/HI.  No other specific complaints in a complete review of systems (except as listed in HPI above).     Objective:   Physical Exam  BP (!) 102/52 (BP Location: Left Arm, Patient Position: Sitting, Cuff Size: Normal)   Ht 4\' 11"  (1.499 m)   Wt 152 lb 6.4 oz (69.1 kg)   LMP 10/09/2023   BMI 30.78 kg/m     Wt Readings from Last 3 Encounters:  12/13/23 153 lb 3.2 oz (69.5 kg)  12/11/23 140 lb (63.5 kg)  12/02/23 149 lb (67.6 kg)    General: Appears her stated age, pregnant, in NAD. Skin: Warm, dry and intact. No rashes, lesions or ulcerations noted. HEENT: Head: normal shape and size; Eyes: sclera white, no icterus, conjunctiva pink, PERRLA and EOMs intact;  Neck:  Neck supple, trachea midline. No masses, lumps or thyromegaly present.  Cardiovascular: Bradycardic with normal rhythm. S1,S2 noted.  No murmur, rubs or gallops noted. No JVD or BLE edema. No carotid bruits noted. Pulmonary/Chest: Normal effort and positive vesicular breath sounds. No respiratory distress. No wheezes, rales or ronchi noted.  Abdomen: Normal bowel sounds.  Musculoskeletal: Strength 5/5 BUE/BLE.  No difficulty with gait.  Neurological: Alert and oriented. Cranial nerves II-XII grossly intact. Coordination normal.  Psychiatric: Mood and affect normal. Behavior is normal. Judgment and thought content normal.     BMET    Component Value Date/Time   NA 134 (L) 12/12/2023 0007   K 3.6 12/12/2023 0007   CL 99 12/12/2023 0007   CO2 22 12/12/2023 0007   GLUCOSE 123 (H) 12/12/2023 0007   BUN 19 12/12/2023 0007   CREATININE 0.51 12/12/2023 0007   CREATININE 0.60 12/22/2022 0853   CALCIUM 9.1 12/12/2023 0007   GFRNONAA >60 12/12/2023 0007    Lipid Panel     Component Value Date/Time   CHOL 218 (H) 12/22/2022 0853   TRIG 76 12/22/2022 0853   HDL 62 12/22/2022 0853   CHOLHDL 3.5 12/22/2022 0853   LDLCALC 139 (H) 12/22/2022 0853    CBC    Component Value Date/Time   WBC 9.8 12/13/2023 1216   WBC 11.8 (H) 12/12/2023 0007   RBC 3.95 12/13/2023 1216   RBC 3.75 (L) 12/12/2023 0007   HGB 12.9 12/13/2023 1216   HCT 39.0 12/13/2023 1216   PLT 320 12/13/2023 1216   MCV 99 (H) 12/13/2023 1216   MCH 32.7 12/13/2023 1216   MCH 33.3 12/12/2023 0007   MCHC 33.1 12/13/2023 1216   MCHC 34.2 12/12/2023 0007    RDW 12.3 12/13/2023 1216   LYMPHSABS 2.1 12/13/2023 1216   MONOABS 0.4 12/12/2023 0007   EOSABS 0.1 12/13/2023 1216   BASOSABS 0.0 12/13/2023 1216    Hgb A1C Lab Results  Component Value Date   HGBA1C 5.2 12/22/2022           Assessment & Plan:   Preventative Health Maintenance:  We will plan to give her a flu shot however she left before we got this done Tetanus UTD Encouraged her to get her COVID-vaccine Pap smear UTD Encouraged her to consume a balanced diet and exercise regimen Advised her to see an eye doctor and dentist annually No labs indicated today    RTC in 1 year, sooner if needed Nicki Reaper, NP

## 2023-12-24 NOTE — Patient Instructions (Signed)

## 2024-01-04 ENCOUNTER — Ambulatory Visit (INDEPENDENT_AMBULATORY_CARE_PROVIDER_SITE_OTHER): Payer: Self-pay | Admitting: Certified Nurse Midwife

## 2024-01-04 VITALS — BP 117/76 | HR 82 | Wt 152.3 lb

## 2024-01-04 DIAGNOSIS — Z3481 Encounter for supervision of other normal pregnancy, first trimester: Secondary | ICD-10-CM

## 2024-01-04 DIAGNOSIS — R300 Dysuria: Secondary | ICD-10-CM | POA: Diagnosis not present

## 2024-01-04 DIAGNOSIS — Z3A12 12 weeks gestation of pregnancy: Secondary | ICD-10-CM

## 2024-01-04 DIAGNOSIS — Z363 Encounter for antenatal screening for malformations: Secondary | ICD-10-CM

## 2024-01-04 DIAGNOSIS — Z23 Encounter for immunization: Secondary | ICD-10-CM

## 2024-01-04 DIAGNOSIS — Z3480 Encounter for supervision of other normal pregnancy, unspecified trimester: Secondary | ICD-10-CM | POA: Insufficient documentation

## 2024-01-04 LAB — POCT URINALYSIS DIPSTICK
Bilirubin, UA: NEGATIVE
Blood, UA: NEGATIVE
Glucose, UA: NEGATIVE
Ketones, UA: NEGATIVE
Leukocytes, UA: NEGATIVE
Nitrite, UA: NEGATIVE
Protein, UA: NEGATIVE
Spec Grav, UA: 1.015 (ref 1.010–1.025)
Urobilinogen, UA: 0.2 U/dL
pH, UA: 7.5 (ref 5.0–8.0)

## 2024-01-04 NOTE — Patient Instructions (Signed)
 Second Trimester of Pregnancy  The second trimester of pregnancy is from week 14 through week 27. This is months 4 through 6 of pregnancy. During the second trimester: Morning sickness is less or has stopped. You may have more energy. You may feel hungry more often. At this time, your unborn baby is growing very fast. At the end of the sixth month, the unborn baby may be up to 12 inches long and weigh about 1 pounds. You will likely start to feel the baby move between 16 and 20 weeks of pregnancy. Body changes during your second trimester Your body continues to change during this time. The changes usually go away after your baby is born. Physical changes You will gain more weight. Your belly will get bigger. You may begin to get stretch marks on your hips, belly, and breasts. Your breasts will keep growing and may hurt. You may get dark spots or blotches on your face. A dark line from your belly button to the pubic area may appear. This line is called linea nigra. Your hair may grow faster and get thicker. Health changes You may have headaches. You may have heartburn. You may pee more often. You may have swollen, bulging veins (varicose veins). You may have trouble pooping (constipation), or swollen veins in the butt that can itch or get painful (hemorrhoids). You may have back pain. This is caused by: Weight gain. Pregnancy hormones that are relaxing the joints in your pelvis. Follow these instructions at home: Medicines Talk to your health care provider if you're taking medicines. Ask if the medicines are safe to take during pregnancy. Your provider may change the medicines that you take. Do not take any medicines unless told to by your provider. Take a prenatal vitamin that has at least 600 micrograms (mcg) of folic acid. Do not use herbal medicines, illegal drugs, or medicines that are not approved by your provider. Eating and drinking While you're pregnant your body needs  extra food for your growing baby. Talk with your provider about what to eat while pregnant. Activity Most women are able to exercise during pregnancy. Exercises may need to change as your pregnancy goes on. Talk to your provider about your activities and exercise routines. Relieving pain and discomfort Wear a good, supportive bra if your breasts hurt. Rest with your legs raised if you have leg cramps or low back pain. Take warm sitz baths to soothe pain from hemorrhoids. Use hemorrhoid cream if your provider says it's okay. Do not douche. Do not use tampons or scented pads. Do not use hot tubs, steam rooms, or saunas. Safety Wear your seatbelt at all times when you're in a car. Talk to your provider if someone hits you, hurts you, or yells at you. Talk with your provider if you're feeling sad or have thoughts of hurting yourself. Lifestyle Certain things can be harmful while you're pregnant. It's best to avoid the following: Do not drink alcohol,smoke, vape, or use products with nicotine or tobacco in them. If you need help quitting, talk with your provider. Avoid cat litter boxes and soil used by cats. These things carry germs that can cause harm to your pregnancy and your baby. General instructions Keep all follow-up visits. It helps you and your unborn baby stay as healthy as possible. Write down your questions. Take them to your prenatal visits. Your provider will: Talk with you about your overall health. Give you advice or refer you to specialists who can help with different needs,  including: Prenatal education classes. Mental health and counseling. Foods and healthy eating. Ask for help if you need help with food. Where to find more information American Pregnancy Association: americanpregnancy.org [employer] of Obstetricians and Gynecologists: acog.org Office on [employer] Health: [employer] Contact a health care provider if: You have a headache that does not go away  when you take medicine. You have any of these problems: You can't eat or drink. You throw up or feel like you may throw up. You have watery poop (diarrhea) for 2 days or more. You have pain when you pee or your pee smells bad. You have been sick for 2 days or more and are not getting better. Contact your provider right away if: You have any of these coming from your vagina: Abnormal discharge. Bad-smelling fluid. Bleeding. Your baby is moving less than usual. You have contractions, belly cramping, or have pain in your pelvis or lower back. You have symptoms of high blood pressure or preeclampsia. These include: A severe, throbbing headache that does not go away. Sudden or extreme swelling of your face, hands, legs, or feet. Vision problems: You see spots. You have blurry vision. Your eyes are sensitive to light. If you can't reach the provider, go to an urgent care or emergency room. Get help right away if: You faint, become confused, or can't think clearly. You have chest pain or trouble breathing. You have any kind of injury, such as from a fall or a car crash. These symptoms may be an emergency. Call 911 right away. Do not wait to see if the symptoms will go away. Do not drive yourself to the hospital. This information is not intended to replace advice given to you by your health care provider. Make sure you discuss any questions you have with your health care provider. Document Revised: 07/22/2023 Document Reviewed: 02/19/2023 Elsevier Patient Education  2024 [employer].

## 2024-01-04 NOTE — Progress Notes (Signed)
 NEW OB HISTORY AND PHYSICAL  SUBJECTIVE:       Kim Oliver is a 33 y.o. G72P1001 female, Patient's last menstrual period was 10/09/2023., Estimated Date of Delivery: 07/15/24, [redacted]w[redacted]d, presents today for establishment of Prenatal Care. She reports dysuria and low back pain, she has an appointment with urology scheduled. Son Monarch nurses a few times over night, she plans to wean before this baby is born. Desires BTL after birth for contraception  Social history Partner/Relationship: Caryn Bee, husband Living situation: live with husband & son, Thiago-17m Work: [employer] Exercise: walking Substance use: denies T/E/D  Indications for ASA therapy (per uptodate) One of the following: Previous pregnancy with preeclampsia, especially early onset and with an adverse outcome No Multifetal gestation No Chronic hypertension No Type 1 or 2 diabetes mellitus No Chronic kidney disease No Autoimmune disease (antiphospholipid syndrome, systemic lupus erythematosus) No  Two or more of the following: Nulliparity No Obesity (body mass index >30 kg/m2) Yes Family history of preeclampsia in mother or sister No Age >=35 years No Sociodemographic characteristics (African American race, low socioeconomic level) No Personal risk factors (eg, previous pregnancy with low birth weight or small for gestational age infant, previous adverse pregnancy outcome [eg, stillbirth], interval >10 years between pregnancies) No   Gynecologic History Patient's last menstrual period was 10/09/2023. Normal Contraception: condoms Last Pap: 11/2021. Results were: normal  Obstetric History OB History  Gravida Para Term Preterm AB Living  2 1 1   1   SAB IAB Ectopic Multiple Live Births     0 1    # Outcome Date GA Lbr Len/2nd Weight Sex Type Anes PTL Lv  2 Current           1 Term 07/16/22 [redacted]w[redacted]d / 00:29 8 lb 6.4 oz (3.81 kg) M Vag-Spont EPI  LIV    Past Medical History:  Diagnosis Date   Medical history  non-contributory    Postpartum care following vaginal delivery 08/03/2022    Past Surgical History:  Procedure Laterality Date   NO PAST SURGERIES      Current Outpatient Medications on File Prior to Visit  Medication Sig Dispense Refill   Prenatal MV-Min-FA-Omega-3 (PRENATAL GUMMIES/DHA & FA) 0.4-32.5 MG CHEW Chew 1 each by mouth daily. 90 tablet 2   No current facility-administered medications on file prior to visit.    No Known Allergies  Social History   Socioeconomic History   Marital status: Married    Spouse name: Caryn Bee   Number of children: 1   Years of education: Not on file   Highest education level: Some college, no degree  Occupational History   Occupation: Packing    Comment: Education officer, [employer]  Tobacco Use   Smoking status: Never   Smokeless tobacco: Never  Vaping Use   Vaping status: Never Used  Substance and Sexual Activity   Alcohol use: Never   Drug use: Never   Sexual activity: Yes  Other Topics Concern   Not on file  Social History Narrative   Not on file   Social Drivers of Health   Financial Resource Strain: Not on file  Food Insecurity: No Food Insecurity (07/16/2022)   Hunger Vital Sign    Worried About Running Out of Food in the Last Year: Never true    Ran Out of Food in the Last Year: Never true  Transportation Needs: No Transportation Needs (07/16/2022)   PRAPARE - Administrator, Civil Service (Medical): No    Lack of Transportation (Non-Medical): No  Physical Activity: Not on file  Stress: No Stress Concern Present (12/13/2023)   [employer] of Occupational Health - Occupational Stress Questionnaire    Feeling of Stress : Not at all  Social Connections: Not on file  Intimate Partner Violence: Not At Risk (12/13/2023)   Humiliation, Afraid, Rape, and Kick questionnaire    Fear of Current or Ex-Partner: No    Emotionally Abused: No    Physically Abused: No    Sexually Abused: No    Family History  Problem Relation  Age of Onset   COPD Maternal Grandfather    Cancer Maternal Grandfather    Alcohol abuse Paternal Grandfather    Dementia Paternal Grandfather    Breast cancer Neg Hx    Colon cancer Neg Hx    Cervical cancer Neg Hx     The following portions of the patient's history were reviewed and updated as appropriate: allergies, current medications, past OB history, past medical history, past surgical history, past family history, past social history, and problem list.  Constitutional: Denied constitutional symptoms, night sweats, recent illness, fatigue, fever, insomnia and weight loss.  Eyes: Denied eye symptoms, eye pain, photophobia, vision change and visual disturbance.  Ears/Nose/Throat/Neck: Denied ear, nose, throat or neck symptoms, hearing loss, nasal discharge, sinus congestion and sore throat.  Cardiovascular: Denied cardiovascular symptoms, arrhythmia, chest pain/pressure, edema, exercise intolerance, orthopnea and palpitations.  Respiratory: Denied pulmonary symptoms, asthma, pleuritic pain, productive sputum, cough, dyspnea and wheezing.  Gastrointestinal: Denied gastro-esophageal reflux, melena, nausea and vomiting.  Genitourinary: Denied genitourinary symptoms including symptomatic vaginal discharge, pelvic relaxation issues  Musculoskeletal: Denied musculoskeletal symptoms, stiffness, swelling, muscle weakness and myalgia.  Dermatologic: Denied dermatology symptoms, rash and scar.  Neurologic: Denied neurology symptoms, dizziness, headache, neck pain and syncope.  Psychiatric: Denied psychiatric symptoms, anxiety and depression.  Endocrine: Denied endocrine symptoms including hot flashes and night sweats.     OBJECTIVE: Initial Physical Exam (New OB)  GENERAL APPEARANCE: alert, well appearing, in no apparent distress, oriented to person, place and time HEAD: normocephalic, atraumatic THYROID: no thyromegaly or masses present BREASTS: no masses noted, no significant tenderness,  no skin changes LUNGS: clear to auscultation, no wheezes, rales or rhonchi, symmetric air entry HEART: regular rate and rhythm, no murmurs ABDOMEN: soft, nontender, nondistended, no abnormal masses, no epigastric pain SKIN: normal coloration and turgor, no rashes NEUROLOGIC: alert, oriented, normal speech, no focal findings or movement disorder noted  PELVIC EXAM: deferred FHR: 150  ASSESSMENT: Normal pregnancy   PLAN: Routine prenatal care. We discussed an overview of prenatal care and when to call. Reviewed diet, exercise, and weight gain recommendations in pregnancy. Discussed benefits of breastfeeding and lactation resources at [facility]. I reviewed labs and answered all questions.  1. Supervision of other normal pregnancy, antepartum (Primary) - POCT Urinalysis Dipstick  2. Encounter for routine screening for malformation using ultrasonics - US OB Comp + 14 Wk; Future  3. Dysuria - Urine Culture  4. [redacted] weeks gestation of pregnancy - POCT Urinalysis Dipstick  5. Encounter for immunization - Flu vaccine trivalent PF, 6mos and older(Flulaval,Afluria,Fluarix,Fluzone)   Dominica Severin, CNM

## 2024-01-06 LAB — URINE CULTURE

## 2024-01-10 ENCOUNTER — Encounter: Payer: Self-pay | Admitting: Certified Nurse Midwife

## 2024-01-25 ENCOUNTER — Ambulatory Visit (INDEPENDENT_AMBULATORY_CARE_PROVIDER_SITE_OTHER): Payer: Self-pay | Admitting: Urology

## 2024-01-25 VITALS — BP 104/61 | HR 65

## 2024-01-25 DIAGNOSIS — R109 Unspecified abdominal pain: Secondary | ICD-10-CM | POA: Diagnosis not present

## 2024-01-25 DIAGNOSIS — R310 Gross hematuria: Secondary | ICD-10-CM | POA: Diagnosis not present

## 2024-01-25 LAB — MICROSCOPIC EXAMINATION: Epithelial Cells (non renal): >10 /HPF — AB (ref 0–10)

## 2024-01-25 LAB — URINALYSIS, COMPLETE
Bilirubin, UA: NEGATIVE
Glucose, UA: NEGATIVE
Ketones, UA: NEGATIVE
Leukocytes,UA: NEGATIVE
Nitrite, UA: NEGATIVE
Specific Gravity, UA: 1.02 (ref 1.005–1.030)
Urobilinogen, Ur: 0.2 mg/dL (ref 0.2–1.0)
pH, UA: 7.5 (ref 5.0–7.5)

## 2024-01-25 NOTE — Progress Notes (Signed)
 I,Amy L Pierron,acting as a scribe for Vanna Scotland, MD.,have documented all relevant documentation on the behalf of Vanna Scotland, MD,as directed by  Vanna Scotland, MD while in the presence of Vanna Scotland, MD.  01/25/2024 9:32 AM   Kim Oliver [birth year] [id number]  Referring provider: Lorre Munroe, NP [address] [place],  [place] [address]  Chief Complaint  Patient presents with   Establish Care   Hematuria    HPI: 33 year-old female referred for evaluation of dark urine and lower left quadrant pain.  She is currently [redacted] weeks pregnant. She had dark urine, lower left quadrant pain. She had a couple urinalyses that were contaminated, but had some microscopic blood, although her urine cultures have been negative. She did have pelvic pain during her previous pregnancy and underwent several MRIs, which had no obvious pathologies.  Her urine today has greater than ten epithelial levels, three to ten red blood cells per high powered field.   She reports that since last year, she has experienced episodes of dark brown and pink urine accompanied by lower back pain. In February, she visited the emergency room due to severe flank pain, and was treated with morphine. The pain was described as the worst she had ever experienced. However, she has not experienced any blood or pain since that episode.   During the ER visit, the baby was checked and found to be fine.   She denies any history of kidney stones, although her mother has had them.   PMH: Past Medical History:  Diagnosis Date   Medical history non-contributory    Postpartum care following vaginal delivery 08/03/2022    Surgical History: Past Surgical History:  Procedure Laterality Date   NO PAST SURGERIES      Home Medications:  Allergies as of 01/25/2024   No Known Allergies      Medication List        Accurate as of January 25, 2024  9:32 AM. If you have any questions, ask your nurse or doctor.           Prenatal Gummies/DHA & FA 0.4-32.5 MG Chew Chew 1 each by mouth daily.       Family History: Family History  Problem Relation Age of Onset   COPD Maternal Grandfather    Cancer Maternal Grandfather    Alcohol abuse Paternal Grandfather    Dementia Paternal Grandfather    Breast cancer Neg Hx    Colon cancer Neg Hx    Cervical cancer Neg Hx     Social History:  reports that she has never smoked. She has never used smokeless tobacco. She reports that she does not drink alcohol and does not use drugs.   Physical Exam: BP 104/61   Pulse 65   LMP 10/09/2023   Constitutional:  Alert and oriented, No acute distress. HEENT: [place] AT, moist mucus membranes.  Trachea midline, no masses. Neurologic: Grossly intact, no focal deficits, moving all 4 extremities. Psychiatric: Normal mood and affect.   Assessment & Plan:    1. Right flank pain - Given her current pregnancy status, an ultrasound of the kidneys is recommended to assess for the presence of stones. If the ultrasound suggests a stone, further evaluation with a CT scan may be considered, weighing the risks of radiation exposure during pregnancy.   - Currently asymptomatic, and the plan is to monitor symptoms and proceed with imaging.  - Advised to seek immediate medical attention if she experiences severe pain or  other concerning symptoms.   2. Hematuria - Evaluate for possible nephrolithiasis as a cause of hematuria. Further investigation may be needed if symptoms persist or worsen.  Return in about 4 weeks (around 02/22/2024) for renal ultrasound results.  I have reviewed the above documentation for accuracy and completeness, and I agree with the above.   Vanna Scotland, MD   [facility] [address], [address] [place], [place] [address] [phone]

## 2024-01-27 ENCOUNTER — Ambulatory Visit
Admission: RE | Admit: 2024-01-27 | Discharge: 2024-01-27 | Disposition: A | Discharge status: Home / Self Care | Source: Ambulatory Visit | Attending: Urology | Admitting: Urology

## 2024-01-27 DIAGNOSIS — R310 Gross hematuria: Secondary | ICD-10-CM | POA: Insufficient documentation

## 2024-01-27 DIAGNOSIS — R109 Unspecified abdominal pain: Secondary | ICD-10-CM | POA: Insufficient documentation

## 2024-01-27 DIAGNOSIS — R319 Hematuria, unspecified: Secondary | ICD-10-CM | POA: Diagnosis not present

## 2024-01-28 ENCOUNTER — Encounter: Payer: Self-pay | Admitting: Urology

## 2024-01-28 DIAGNOSIS — R1031 Right lower quadrant pain: Secondary | ICD-10-CM

## 2024-02-02 ENCOUNTER — Encounter: Payer: Self-pay | Admitting: Advanced Practice Midwife

## 2024-02-02 ENCOUNTER — Ambulatory Visit (INDEPENDENT_AMBULATORY_CARE_PROVIDER_SITE_OTHER): Admitting: Advanced Practice Midwife

## 2024-02-02 VITALS — BP 98/53 | HR 74 | Wt 158.0 lb

## 2024-02-02 DIAGNOSIS — Z3480 Encounter for supervision of other normal pregnancy, unspecified trimester: Secondary | ICD-10-CM

## 2024-02-02 DIAGNOSIS — Z3A16 16 weeks gestation of pregnancy: Secondary | ICD-10-CM | POA: Diagnosis not present

## 2024-02-02 NOTE — Progress Notes (Signed)
 Routine Prenatal Care Visit  Subjective  Kim Oliver is a [name] at [redacted]w[redacted]d being seen today for ongoing prenatal care.  She is currently monitored for the following issues for this low-risk pregnancy and has Overweight with body mass index (BMI) of 25 to 25.9 in adult; Gastroesophageal reflux disease without esophagitis; and Supervision of other normal pregnancy, antepartum on their problem list.  ----------------------------------------------------------------------------------- Patient reports she is feeling well. Accompanied by her husband and son today. Has anatomy scan scheduled on 4/21. Contractions: Not present. Vag. Bleeding: None.  Movement: Absent. Leaking Fluid denies.  ----------------------------------------------------------------------------------- The following portions of the patient's history were reviewed and updated as appropriate: allergies, current medications, past family history, past medical history, past social history, past surgical history and problem list. Problem list updated.  Objective  Blood pressure (!) 98/53, pulse 74, weight 158 lb (71.7 kg), last menstrual period 10/09/2023, currently breastfeeding. Pregravid weight 148 lb 9.6 oz (67.4 kg) Total Weight Gain 9 lb 6.4 oz (4.264 kg) Urinalysis: Urine Protein    Urine Glucose    Fetal Status: Fetal Heart Rate (bpm): 143   Movement: Absent     General:  Alert, oriented and cooperative. Patient is in no acute distress.  Skin: Skin is warm and dry. No rash noted.   Cardiovascular: Normal heart rate noted  Respiratory: Normal respiratory effort, no problems with respiration noted  Abdomen: Soft, gravid, appropriate for gestational age. Pain/Pressure: Absent     Pelvic:  Cervical exam deferred        Extremities: Normal range of motion.  Edema: None  Mental Status: Normal mood and affect. Normal behavior. Normal judgment and thought content.   Assessment   [name] at [redacted]w[redacted]d by  07/15/2024, by  Last Menstrual Period presenting for routine prenatal visit  Plan   G2 Problems (from 12/13/23 to present)     Problem Noted Diagnosed Resolved   Supervision of other normal pregnancy, antepartum 01/04/2024 by Dominica Severin, CNM  No   Overview Addendum 01/04/2024 10:45 AM by Dominica Severin, CNM   Clinical Staff Provider  Office Location  [facility] Dating  07/15/2024, by Last Menstrual Period  Language  English Anatomy US    Flu Vaccine  01/04/24 Genetic Screen  NIPS: low risk, XX  RSV Vaccine      Covid Vaccine      TDaP vaccine    Hgb A1C or  GTT Early : Third trimester :   Covid x2   LAB RESULTS   Rhogam  O/Positive/-- (02/10 1216)  Blood Type O/Positive/-- (02/10 1216)   RSV  Antibody Negative (02/10 1216)  Feeding Plan breast Rubella 2.85 (02/10 1216)  Contraception BTL RPR Non Reactive (02/10 1216)   Circumcision N/a HBsAg Negative (02/10 1216)   Pediatrician   HIV Non Reactive (02/10 1216)  Support Person Caryn Bee Varicella Reactive (02/10 1216)  Prenatal Classes  GBS  (For PCN allergy, check sensitivities)     Hep C Non Reactive (02/10 1216)   BTL Consent  Pap Diagnosis  Date Value Ref Range Status  12/01/2021   Final   - Negative for intraepithelial lesion or malignancy (NILM)    VBAC Consent  Hgb Electro      CF      SMA                    Preterm labor symptoms and general obstetric precautions including but not limited to vaginal bleeding, contractions, leaking of fluid and  fetal movement were reviewed in detail with the patient. Please refer to After Visit Summary for other counseling recommendations.   Return in about 4 weeks (around 03/01/2024) for rob.  Tresea Mall, CNM 02/02/2024 11:04 AM

## 2024-02-09 ENCOUNTER — Ambulatory Visit
Admission: RE | Admit: 2024-02-09 | Discharge: 2024-02-09 | Disposition: A | Discharge status: Home / Self Care | Source: Ambulatory Visit | Attending: Physician Assistant | Admitting: Physician Assistant

## 2024-02-09 DIAGNOSIS — R1031 Right lower quadrant pain: Secondary | ICD-10-CM

## 2024-02-21 ENCOUNTER — Ambulatory Visit (INDEPENDENT_AMBULATORY_CARE_PROVIDER_SITE_OTHER): Admitting: Obstetrics & Gynecology

## 2024-02-21 ENCOUNTER — Ambulatory Visit (INDEPENDENT_AMBULATORY_CARE_PROVIDER_SITE_OTHER)

## 2024-02-21 VITALS — BP 89/53 | HR 62 | Wt 161.0 lb

## 2024-02-21 DIAGNOSIS — Z3A19 19 weeks gestation of pregnancy: Secondary | ICD-10-CM | POA: Diagnosis not present

## 2024-02-21 DIAGNOSIS — Z3A18 18 weeks gestation of pregnancy: Secondary | ICD-10-CM

## 2024-02-21 DIAGNOSIS — O43199 Other malformation of placenta, unspecified trimester: Secondary | ICD-10-CM

## 2024-02-21 DIAGNOSIS — O43192 Other malformation of placenta, second trimester: Secondary | ICD-10-CM

## 2024-02-21 DIAGNOSIS — Z363 Encounter for antenatal screening for malformations: Secondary | ICD-10-CM

## 2024-02-21 DIAGNOSIS — Z3480 Encounter for supervision of other normal pregnancy, unspecified trimester: Secondary | ICD-10-CM

## 2024-02-21 NOTE — Progress Notes (Signed)
   PRENATAL VISIT NOTE  Subjective:  Kim Oliver is a [name] at [redacted]w[redacted]d being seen today for ongoing prenatal care.  She is currently monitored for the following issues for this low-risk pregnancy and has Overweight with body mass index (BMI) of 25 to 25.9 in adult; Gastroesophageal reflux disease without esophagitis; Supervision of other normal pregnancy, antepartum; and Marginal insertion of umbilical cord affecting management of mother on their problem list.  Patient reports no complaints.  Contractions: Not present. Vag. Bleeding: None.  Movement: Absent. Denies leaking of fluid.   The following portions of the patient's history were reviewed and updated as appropriate: allergies, current medications, past family history, past medical history, past social history, past surgical history and problem list.   Objective:   Vitals:   02/21/24 1005  BP: (!) 89/53  Pulse: 62  Weight: 161 lb (73 kg)    Fetal Status: Fetal Heart Rate (bpm): 142 Fundal Height: 20 cm Movement: Absent     General:  Alert, oriented and cooperative. Patient is in no acute distress.  Skin: Skin is warm and dry. No rash noted.   Cardiovascular: Normal heart rate noted  Respiratory: Normal respiratory effort, no problems with respiration noted  Abdomen: Soft, gravid, appropriate for gestational age.  Pain/Pressure: Absent     Pelvic: Cervical exam deferred        Extremities: Normal range of motion.     Mental Status: Normal mood and affect. Normal behavior. Normal judgment and thought content.   Assessment and Plan:  Pregnancy: G2P1001 at [redacted]w[redacted]d 1. Supervision of other normal pregnancy, antepartum (Primary) - I reassured her that she will feel fetal movement in the near future  2. [redacted] weeks gestation of pregnancy   3. Marginal insertion of umbilical cord affecting management of mother - less than 1cm from edge, so will need follow scan for growth  Preterm labor symptoms and general obstetric  precautions including but not limited to vaginal bleeding, contractions, leaking of fluid and fetal movement were reviewed in detail with the patient. Please refer to After Visit Summary for other counseling recommendations.   Return in about 4 weeks (around 03/20/2024).  Future Appointments  Date Time Provider Department Center  12/25/2024  1:20 PM Baity, Rankin Buzzard, NP [facility]    Ana Balling, MD

## 2024-03-04 ENCOUNTER — Emergency Department

## 2024-03-04 ENCOUNTER — Emergency Department
Admission: EM | Admit: 2024-03-04 | Discharge: 2024-03-04 | Disposition: A | Discharge status: Home / Self Care | Attending: Emergency Medicine | Admitting: Emergency Medicine

## 2024-03-04 DIAGNOSIS — Z3A21 21 weeks gestation of pregnancy: Secondary | ICD-10-CM | POA: Diagnosis not present

## 2024-03-04 DIAGNOSIS — D72829 Elevated white blood cell count, unspecified: Secondary | ICD-10-CM | POA: Insufficient documentation

## 2024-03-04 DIAGNOSIS — N200 Calculus of kidney: Secondary | ICD-10-CM | POA: Diagnosis not present

## 2024-03-04 DIAGNOSIS — O26892 Other specified pregnancy related conditions, second trimester: Secondary | ICD-10-CM | POA: Diagnosis not present

## 2024-03-04 DIAGNOSIS — O99112 Other diseases of the blood and blood-forming organs and certain disorders involving the immune mechanism complicating pregnancy, second trimester: Secondary | ICD-10-CM | POA: Insufficient documentation

## 2024-03-04 DIAGNOSIS — R109 Unspecified abdominal pain: Secondary | ICD-10-CM

## 2024-03-04 DIAGNOSIS — N132 Hydronephrosis with renal and ureteral calculous obstruction: Secondary | ICD-10-CM | POA: Insufficient documentation

## 2024-03-04 DIAGNOSIS — O26832 Pregnancy related renal disease, second trimester: Secondary | ICD-10-CM | POA: Insufficient documentation

## 2024-03-04 DIAGNOSIS — D252 Subserosal leiomyoma of uterus: Secondary | ICD-10-CM | POA: Diagnosis not present

## 2024-03-04 LAB — PREGNANCY, URINE: Preg Test, Ur: POSITIVE — AB

## 2024-03-04 LAB — URINALYSIS, ROUTINE W REFLEX MICROSCOPIC
Bilirubin Urine: NEGATIVE
Glucose, UA: NEGATIVE mg/dL
Hgb urine dipstick: NEGATIVE
Ketones, ur: NEGATIVE mg/dL
Nitrite: NEGATIVE
Protein, ur: NEGATIVE mg/dL
Specific Gravity, Urine: 1.024 (ref 1.005–1.030)
pH: 6 (ref 5.0–8.0)

## 2024-03-04 LAB — CBC
HCT: 35.8 % — ABNORMAL LOW (ref 36.0–46.0)
Hemoglobin: 12.3 g/dL (ref 12.0–15.0)
MCH: 33.6 pg (ref 26.0–34.0)
MCHC: 34.4 g/dL (ref 30.0–36.0)
MCV: 97.8 fL (ref 80.0–100.0)
Platelets: 231 10*3/uL (ref 150–400)
RBC: 3.66 MIL/uL — ABNORMAL LOW (ref 3.87–5.11)
RDW: 13.6 % (ref 11.5–15.5)
WBC: 13.4 10*3/uL — ABNORMAL HIGH (ref 4.0–10.5)
nRBC: 0 % (ref 0.0–0.2)

## 2024-03-04 LAB — BASIC METABOLIC PANEL WITH GFR
Anion gap: 10 (ref 5–15)
BUN: 12 mg/dL (ref 6–20)
CO2: 21 mmol/L — ABNORMAL LOW (ref 22–32)
Calcium: 8.7 mg/dL — ABNORMAL LOW (ref 8.9–10.3)
Chloride: 104 mmol/L (ref 98–111)
Creatinine, Ser: 0.56 mg/dL (ref 0.44–1.00)
GFR, Estimated: >60 mL/min (ref >60)
Glucose, Bld: 98 mg/dL (ref 70–99)
Potassium: 3.5 mmol/L (ref 3.5–5.1)
Sodium: 135 mmol/L (ref 135–145)

## 2024-03-04 LAB — LIPASE, BLOOD: Lipase: 32 U/L (ref 11–51)

## 2024-03-04 LAB — HEPATIC FUNCTION PANEL
ALT: 19 U/L (ref 0–44)
AST: 18 U/L (ref 15–41)
Albumin: 3.3 g/dL — ABNORMAL LOW (ref 3.5–5.0)
Alkaline Phosphatase: 43 U/L (ref 38–126)
Bilirubin, Direct: <0.1 mg/dL (ref 0.0–0.2)
Total Bilirubin: 0.6 mg/dL (ref 0.0–1.2)
Total Protein: 6.7 g/dL (ref 6.5–8.1)

## 2024-03-04 MED ORDER — MORPHINE SULFATE (PF) 4 MG/ML IV SOLN
4.0000 mg | Freq: Once | INTRAVENOUS | Status: AC
  Administered 2024-03-04: 4 mg via INTRAVENOUS
  Filled 2024-03-04: qty 1

## 2024-03-04 MED ORDER — OXYCODONE HCL 5 MG PO TABS: 5.0000 mg | ORAL_TABLET | Freq: Four times a day (QID) | ORAL | 0 refills | Status: AC | PRN

## 2024-03-04 MED ORDER — ACETAMINOPHEN 500 MG PO TABS
1000.0000 mg | ORAL_TABLET | Freq: Once | ORAL | Status: AC
  Administered 2024-03-04: 1000 mg via ORAL
  Filled 2024-03-04: qty 2

## 2024-03-04 MED ORDER — ONDANSETRON HCL 4 MG/2ML IJ SOLN
4.0000 mg | Freq: Once | INTRAMUSCULAR | Status: AC
  Administered 2024-03-04: 4 mg via INTRAVENOUS
  Filled 2024-03-04: qty 2

## 2024-03-04 MED ORDER — ONDANSETRON 4 MG PO TBDP: 4.0000 mg | ORAL_TABLET | Freq: Three times a day (TID) | ORAL | 0 refills | Status: AC | PRN

## 2024-03-04 MED ORDER — SODIUM CHLORIDE 0.9 % IV BOLUS
1000.0000 mL | Freq: Once | INTRAVENOUS | Status: AC
  Administered 2024-03-04: 1000 mL via INTRAVENOUS

## 2024-03-04 NOTE — ED Notes (Signed)
 Patient ambulated to and from hallway bathroom with a steady gait. Mother is at bedside. Room is darkened for patient's comfort. Patient appears more comfortable.

## 2024-03-04 NOTE — ED Notes (Signed)
 Dr. Peggi Bowels consulted about use of morphine , who stated that patient asked for it and has been given it in the past during pregnancy. Patient states she drove here, but can call her husband to pick her up.

## 2024-03-04 NOTE — Discharge Instructions (Addendum)
 You have a kidney stone. See report below.   Take tylenol  1g every 8 hours daily. Take oxycodone  for breakthrough pain. Do not drive, work, or operate machinery while on this.  Try to limit use given pregnancy- do not take close to delivery. Take capful of miralax with it.  Take zofran  to help with nausea. D.w OB taking flomax if they want to start you on that.  Call urology number above to schedule outpatient appointment. Return to ED for fevers, unable to keep food down, or any other concerns.     Moderate right hydroureteronephrosis and mild perinephric fluid, due  to 4 mm distal right ureteral calculus near the UVJ.    Single intrauterine fetus in cephalic lie.    Multiple uterine fibroids, largest measuring 5.6 cm.     Take oxycodone  as prescribed. Do not drink alcohol, drive or participate in any other potentially dangerous activities while taking this medication as it may make you sleepy. Do not take this medication with any other sedating medications, either prescription or over-the-counter. If you were prescribed Percocet or Vicodin, do not take these with acetaminophen  (Tylenol ) as it is already contained within these medications.  This medication is an opiate (or narcotic) pain medication and can be habit forming. Use it as little as possible to achieve adequate pain control. Do not use or use it with extreme caution if you have a history of opiate abuse or dependence. If you are on a pain contract with your primary care doctor or a pain specialist, be sure to let them know you were prescribed this medication today from the Emergency Department. This medication is intended for your use only - do not give any to anyone else and keep it in a secure place where nobody else, especially children, have access to it.

## 2024-03-04 NOTE — ED Triage Notes (Signed)
 Pt to ED from home with right sided flank pain that radiates to the front for two days. Pt is [redacted] weeks pregnant on standard risk precautions. Pt states that she has seen a urologist for this issue before and they suspect kidney stones but were waiting for her to get birth before doing further imaging.

## 2024-03-04 NOTE — ED Provider Notes (Addendum)
 [facility] Provider Note    Event Date/Time   First MD Initiated Contact with Patient 03/04/24 [phone]     (approximate)   History   Flank Pain   HPI  Kim Oliver is a 33 y.o. female   who is currently [redacted] weeks pregnant who comes in with concerns for right-sided abdominal pain.  Patient reports severe onset of right sided abdominal pain that started just prior to arrival/this morning.  She reports it has been associate with some nausea and vomiting.  She was told that she could have kidney stones but has not had it fully worked up.  She denies any obvious urinary symptoms.  No shortness of breath no chest pain.  She denies any gush of fluids, vaginal bleeding.  States that this happened previously and when she came in she had an ultrasound that showed the baby looked good back in February.   I reviewed the notes were patient seen on 12/11/2023 with right-sided abdominal pain that confirmed uterine pregnancy.  On 3/25 I reviewed the note from urology Dr. Ace Holder where patient was [redacted] weeks pregnant and was having left lower quadrant abdominal pain.  She has had prior MRIs during prior pregnancy.  She underwent an ultrasound that showed mild right-sided hydronephrosis and had ordered a CT scan on 02/09/2024 to further evaluate this.  She reports right-sided flank pain that radiates to the front for the past 2 days.  She is [redacted] weeks pregnant.  They were waiting.  after birth to do the CT scan per the patient.   Physical Exam   Triage Vital Signs: ED Triage Vitals  Encounter Vitals Group     BP 03/04/24 0903 100/64     Systolic BP Percentile --      Diastolic BP Percentile --      Pulse Rate 03/04/24 0902 70     Resp 03/04/24 0902 18     Temp 03/04/24 0902 98.1 F (36.7 C)     Temp Source 03/04/24 0902 Oral     SpO2 03/04/24 0902 100 %     Weight --      Height --      Head Circumference --      Peak Flow --      Pain Score 03/04/24 0902 7     Pain Loc --       Pain Education --      Exclude from Growth Chart --     Most recent vital signs: Vitals:   03/04/24 0902 03/04/24 0903  BP:  100/64  Pulse: 70   Resp: 18   Temp: 98.1 F (36.7 C)   SpO2: 100%      General: Awake, no distress.  CV:  Good peripheral perfusion.  Resp:  Normal effort.  Abd:  No distention.  No significant pain with palpation but she reports some right flank pain radiating to her right groin Other:     ED Results / Procedures / Treatments   Labs (all labs ordered are listed, but only abnormal results are displayed) Labs Reviewed  URINALYSIS, ROUTINE W REFLEX MICROSCOPIC  CBC  BASIC METABOLIC PANEL WITH GFR  POC URINE PREG, ED      RADIOLOGY MRI reviewed and personally interpreted and positive kidney stone  PROCEDURES:  Critical Care performed: No  Procedures   MEDICATIONS ORDERED IN ED: Medications  acetaminophen  (TYLENOL ) tablet 1,000 mg (has no administration in time range)  sodium chloride  0.9 % bolus 1,000 mL (1,000  mLs Intravenous New Bag/Given 03/04/24 1058)  morphine  (PF) 4 MG/ML injection 4 mg (4 mg Intravenous Given 03/04/24 1103)  ondansetron  (ZOFRAN ) injection 4 mg (4 mg Intravenous Given 03/04/24 1101)     IMPRESSION / MDM / ASSESSMENT AND PLAN / ED COURSE  I reviewed the triage vital signs and the nursing notes.   Patient's presentation is most consistent with acute presentation with potential threat to life or bodily function.   Patient comes in with right lower quadrant pain.  States that typically she gets morphine .  Patient is pregnant and will need to have a ride home she expressed understanding.  Workup was done evaluate for electrolytes, AKI, UTI.  Doubt Pyelo given she reports intermittent nature of the symptoms could be kidney stone versus appendicitis.  No right upper quadrant pain to suggest gallstones.  Anice Kerbs- D/w her so they are aware pt is here does not think needs to come up for testing- recommend FHT and  workup for abdominal pain.   12:22 PM reevaluated patient still having right lower quadrant pain patient will be given some Tylenol .  Patient's urine with more squamous cells and WBCs that does not seem consistent with UTI.  CBC does show elevated white count.  BMP shows normal creatinine.  LFTs are normal.  Lipase normal. White count elevated more lilely reactive. Pt has no fever here..    Moderate right hydroureteronephrosis and mild perinephric fluid, due  to 4 mm distal right ureteral calculus near the UVJ.    Single intrauterine fetus in cephalic lie.    Multiple uterine fibroids, largest measuring 5.6 cm.     Discussd with Wrenn from urology and recommend expcentant management and d/w OB  Discussed with Lonzell Robin no known risk way risk and benefit of flomax- okwith with oxycodone  and zofran .  Does not need to come upstairs for monitoring.  Discussed with patient she want to hold off on the Flomax for right now and will follow-up with her OB/GYN.  She understands that the oxycodone  is not biggest risk with taken right before delivery so to try not to use it unless she absolutely has to.  Patient pain well-controlled no fever no evidence of infection and she feels comfortable with outpatient follow-up and understands return for fever   FINAL CLINICAL IMPRESSION(S) / ED DIAGNOSES   Final diagnoses:  Right flank pain  Kidney stone     Rx / DC Orders   ED Discharge Orders          Ordered    oxyCODONE  (ROXICODONE ) 5 MG immediate release tablet  Every 6 hours PRN        03/04/24 1602    ondansetron  (ZOFRAN -ODT) 4 MG disintegrating tablet  Every 8 hours PRN        03/04/24 1602             Note:  This document was prepared using Dragon voice recognition software and may include unintentional dictation errors.   Lubertha Rush, MD 03/04/24 1441    Lubertha Rush, MD 03/04/24 1609    Lubertha Rush, MD 03/04/24 [phone]

## 2024-03-04 NOTE — ED Notes (Signed)
 Patient taken to MRI

## 2024-03-05 LAB — URINE CULTURE: Culture: NO GROWTH

## 2024-03-22 ENCOUNTER — Ambulatory Visit (INDEPENDENT_AMBULATORY_CARE_PROVIDER_SITE_OTHER): Admitting: Certified Nurse Midwife

## 2024-03-22 VITALS — BP 112/70 | HR 82 | Wt 168.6 lb

## 2024-03-22 DIAGNOSIS — O99891 Other specified diseases and conditions complicating pregnancy: Secondary | ICD-10-CM | POA: Insufficient documentation

## 2024-03-22 DIAGNOSIS — O43192 Other malformation of placenta, second trimester: Secondary | ICD-10-CM

## 2024-03-22 DIAGNOSIS — R109 Unspecified abdominal pain: Secondary | ICD-10-CM | POA: Diagnosis not present

## 2024-03-22 DIAGNOSIS — O26899 Other specified pregnancy related conditions, unspecified trimester: Secondary | ICD-10-CM | POA: Diagnosis not present

## 2024-03-22 DIAGNOSIS — N200 Calculus of kidney: Secondary | ICD-10-CM

## 2024-03-22 DIAGNOSIS — Z3480 Encounter for supervision of other normal pregnancy, unspecified trimester: Secondary | ICD-10-CM

## 2024-03-22 DIAGNOSIS — Z3A23 23 weeks gestation of pregnancy: Secondary | ICD-10-CM

## 2024-03-22 DIAGNOSIS — Z369 Encounter for antenatal screening, unspecified: Secondary | ICD-10-CM

## 2024-03-22 DIAGNOSIS — D259 Leiomyoma of uterus, unspecified: Secondary | ICD-10-CM

## 2024-03-22 DIAGNOSIS — O43199 Other malformation of placenta, unspecified trimester: Secondary | ICD-10-CM

## 2024-03-22 DIAGNOSIS — Z113 Encounter for screening for infections with a predominantly sexual mode of transmission: Secondary | ICD-10-CM

## 2024-03-22 DIAGNOSIS — Z114 Encounter for screening for human immunodeficiency virus [HIV]: Secondary | ICD-10-CM

## 2024-03-22 DIAGNOSIS — Z131 Encounter for screening for diabetes mellitus: Secondary | ICD-10-CM

## 2024-03-22 MED ORDER — TAMSULOSIN HCL 0.4 MG PO CAPS
0.4000 mg | ORAL_CAPSULE | Freq: Every day | ORAL | 0 refills | Status: AC
Start: 2024-03-22 — End: ?

## 2024-03-22 NOTE — Assessment & Plan Note (Signed)
 Encouraged Kim Oliver to call urologist & follow up given MRI diagnosed kidney stone, will trial flomax to assist in passing of stone. Reinforced signs of infection & when to return to ED to seek urgent care.

## 2024-03-22 NOTE — Progress Notes (Signed)
    Return Prenatal Note   Subjective   [name] at [redacted]w[redacted]d presents for this follow-up prenatal visit.  Patient continues to have right sided flank pain. Was seen in ED about 3 weeks ago and MRI completed showing ureteral stone. Denies fever, chills or other signs of infection, right sided pain comes and goes, tylenol  helps but she needs to take it every 6h, has not taken oxycodone  prescribed in ED. Has felt occasional flutters but no regular movement. Patient reports: Movement: Present Contractions: Not present  Objective   Flow sheet Vitals: Pulse Rate: 82 BP: 112/70 Fundal Height: 23 cm Fetal Heart Rate (bpm): 145 Total weight gain: 20 lb (9.072 kg)  General Appearance  No acute distress, well appearing, and well nourished Pulmonary   Normal work of breathing Neurologic   Alert and oriented to person, place, and time Psychiatric   Mood and affect within normal limits  Assessment/Plan   Plan  [name] at [redacted]w[redacted]d presents for follow-up OB visit. Reviewed prenatal record including previous visit note.  Kidney stone complicating pregnancy Encouraged Shelagh Derrick to call urologist & follow up given MRI diagnosed kidney stone, will trial flomax to assist in passing of stone. Reinforced signs of infection & when to return to ED to seek urgent care.  Supervision of other normal pregnancy, antepartum Red flag symptoms reviewed. Anticipatory guidance for 28 week labs discussed.  Marginal insertion of umbilical cord affecting management of mother Ultrasound for growth ordered.      Orders Placed This Encounter  Procedures   Urine Culture   US  OB Comp + 14 Wk    Standing Status:   Future    Expected Date:   04/22/2024    Expiration Date:   06/22/2024    Reason for Exam (SYMPTOM  OR DIAGNOSIS REQUIRED):   growth, hx of fibroids    Preferred Imaging Location?:   [facility]   28 Week RH+Panel    Standing Status:   Future    Expected Date:   04/21/2024     Expiration Date:   03/21/2025   Urinalysis, Routine w reflex microscopic   Return in 4 weeks (on 04/19/2024) for ROB & GDM screening.   Future Appointments  Date Time Provider Department Center  03/28/2024  8:00 AM [facility] US  2 [facility]-US  [facility]  04/20/2024  9:00 AM [facility]N LAB [facility] None  04/20/2024  9:35 AM Forestine Igo, CNM [facility] None  12/25/2024  1:20 PM Thalia Filler Rankin Buzzard, NP [facility]    For next visit:  ROB with 1 hour glucola, third trimester labs, and Tdap     Forestine Igo, CNM  03/23/2511:39 PM

## 2024-03-22 NOTE — Patient Instructions (Signed)
 Second Trimester of Pregnancy  The second trimester of pregnancy is from week 14 through week 27. This is months 4 through 6 of pregnancy. During the second trimester: Morning sickness is less or has stopped. You may have more energy. You may feel hungry more often. At this time, your unborn baby is growing very fast. At the end of the sixth month, the unborn baby may be up to 12 inches long and weigh about 1 pounds. You will likely start to feel the baby move between 16 and 20 weeks of pregnancy. Body changes during your second trimester Your body continues to change during this time. The changes usually go away after your baby is born. Physical changes You will gain more weight. Your belly will get bigger. You may begin to get stretch marks on your hips, belly, and breasts. Your breasts will keep growing and may hurt. You may get dark spots or blotches on your face. A dark line from your belly button to the pubic area may appear. This line is called linea nigra. Your hair may grow faster and get thicker. Health changes You may have headaches. You may have heartburn. You may pee more often. You may have swollen, bulging veins (varicose veins). You may have trouble pooping (constipation), or swollen veins in the butt that can itch or get painful (hemorrhoids). You may have back pain. This is caused by: Weight gain. Pregnancy hormones that are relaxing the joints in your pelvis. Follow these instructions at home: Medicines Talk to your health care provider if you're taking medicines. Ask if the medicines are safe to take during pregnancy. Your provider may change the medicines that you take. Do not take any medicines unless told to by your provider. Take a prenatal vitamin that has at least 600 micrograms (mcg) of folic acid. Do not use herbal medicines, illegal drugs, or medicines that are not approved by your provider. Eating and drinking While you're pregnant your body needs  extra food for your growing baby. Talk with your provider about what to eat while pregnant. Activity Most women are able to exercise during pregnancy. Exercises may need to change as your pregnancy goes on. Talk to your provider about your activities and exercise routines. Relieving pain and discomfort Wear a good, supportive bra if your breasts hurt. Rest with your legs raised if you have leg cramps or low back pain. Take warm sitz baths to soothe pain from hemorrhoids. Use hemorrhoid cream if your provider says it's okay. Do not douche. Do not use tampons or scented pads. Do not use hot tubs, steam rooms, or saunas. Safety Wear your seatbelt at all times when you're in a car. Talk to your provider if someone hits you, hurts you, or yells at you. Talk with your provider if you're feeling sad or have thoughts of hurting yourself. Lifestyle Certain things can be harmful while you're pregnant. It's best to avoid the following: Do not drink alcohol,smoke, vape, or use products with nicotine or tobacco in them. If you need help quitting, talk with your provider. Avoid cat litter boxes and soil used by cats. These things carry germs that can cause harm to your pregnancy and your baby. General instructions Keep all follow-up visits. It helps you and your unborn baby stay as healthy as possible. Write down your questions. Take them to your prenatal visits. Your provider will: Talk with you about your overall health. Give you advice or refer you to specialists who can help with different needs,  including: Prenatal education classes. Mental health and counseling. Foods and healthy eating. Ask for help if you need help with food. Where to find more information American Pregnancy Association: americanpregnancy.org [employer] of Obstetricians and Gynecologists: acog.org Office on [employer] Health: [employer] Contact a health care provider if: You have a headache that does not go away  when you take medicine. You have any of these problems: You can't eat or drink. You throw up or feel like you may throw up. You have watery poop (diarrhea) for 2 days or more. You have pain when you pee or your pee smells bad. You have been sick for 2 days or more and are not getting better. Contact your provider right away if: You have any of these coming from your vagina: Abnormal discharge. Bad-smelling fluid. Bleeding. Your baby is moving less than usual. You have contractions, belly cramping, or have pain in your pelvis or lower back. You have symptoms of high blood pressure or preeclampsia. These include: A severe, throbbing headache that does not go away. Sudden or extreme swelling of your face, hands, legs, or feet. Vision problems: You see spots. You have blurry vision. Your eyes are sensitive to light. If you can't reach the provider, go to an urgent care or emergency room. Get help right away if: You faint, become confused, or can't think clearly. You have chest pain or trouble breathing. You have any kind of injury, such as from a fall or a car crash. These symptoms may be an emergency. Call 911 right away. Do not wait to see if the symptoms will go away. Do not drive yourself to the hospital. This information is not intended to replace advice given to you by your health care provider. Make sure you discuss any questions you have with your health care provider. Document Revised: 07/22/2023 Document Reviewed: 02/19/2023 Elsevier Patient Education  2024 [employer].

## 2024-03-22 NOTE — Assessment & Plan Note (Signed)
Ultrasound for growth ordered

## 2024-03-22 NOTE — Assessment & Plan Note (Signed)
 Red flag symptoms reviewed. Anticipatory guidance for 28 week labs discussed.

## 2024-03-23 LAB — URINALYSIS, ROUTINE W REFLEX MICROSCOPIC
Bilirubin, UA: NEGATIVE
Glucose, UA: NEGATIVE
Ketones, UA: NEGATIVE
Leukocytes,UA: NEGATIVE
Nitrite, UA: NEGATIVE
Protein,UA: NEGATIVE
Specific Gravity, UA: 1.02 (ref 1.005–1.030)
Urobilinogen, Ur: 0.2 mg/dL (ref 0.2–1.0)
pH, UA: 6 (ref 5.0–7.5)

## 2024-03-23 LAB — MICROSCOPIC EXAMINATION: Casts: NONE SEEN /LPF

## 2024-03-24 LAB — URINE CULTURE

## 2024-03-28 ENCOUNTER — Encounter (HOSPITAL_COMMUNITY): Payer: Self-pay

## 2024-03-28 ENCOUNTER — Ambulatory Visit: Payer: Self-pay | Admitting: Certified Nurse Midwife

## 2024-03-28 ENCOUNTER — Ambulatory Visit (HOSPITAL_COMMUNITY)
Admission: RE | Admit: 2024-03-28 | Discharge: 2024-03-28 | Disposition: A | Discharge status: Home / Self Care | Source: Ambulatory Visit | Attending: Certified Nurse Midwife | Admitting: Certified Nurse Midwife

## 2024-03-28 DIAGNOSIS — O43199 Other malformation of placenta, unspecified trimester: Secondary | ICD-10-CM

## 2024-03-28 DIAGNOSIS — D259 Leiomyoma of uterus, unspecified: Secondary | ICD-10-CM

## 2024-04-15 IMAGING — US US MFM OB DETAIL+14 WK
1 series · 13 of 28 positions shown · non-contrast
Comparison: none

[Series 1: us mfm ob detail+14 wk · 13 of 109 slices shown]
[im 5/109]
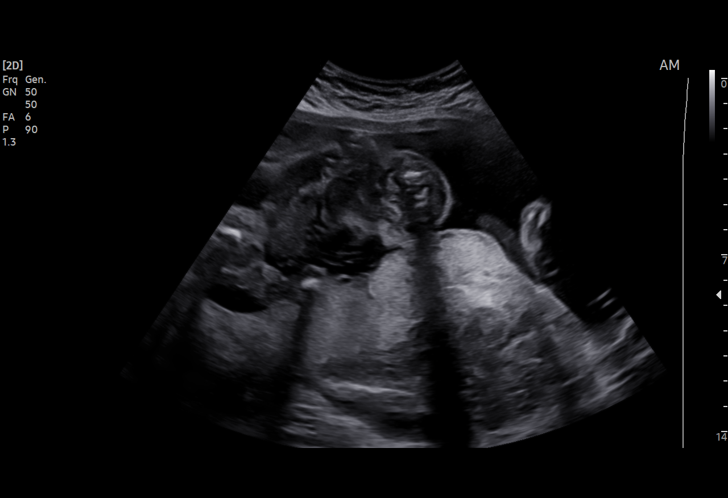
[im 13/109]
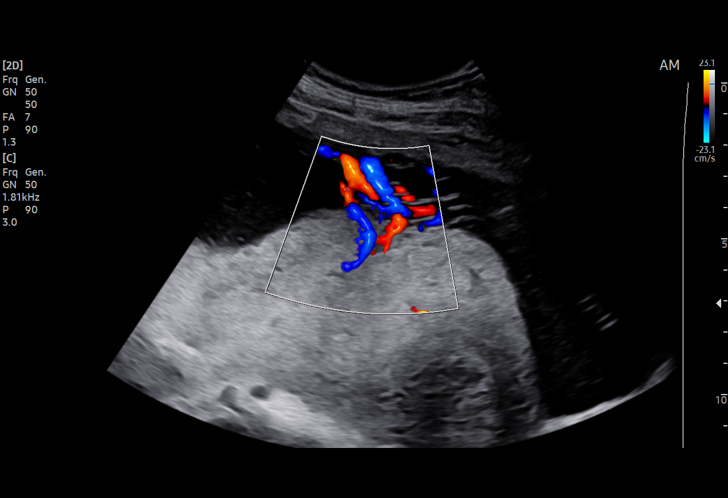
[im 21/109]
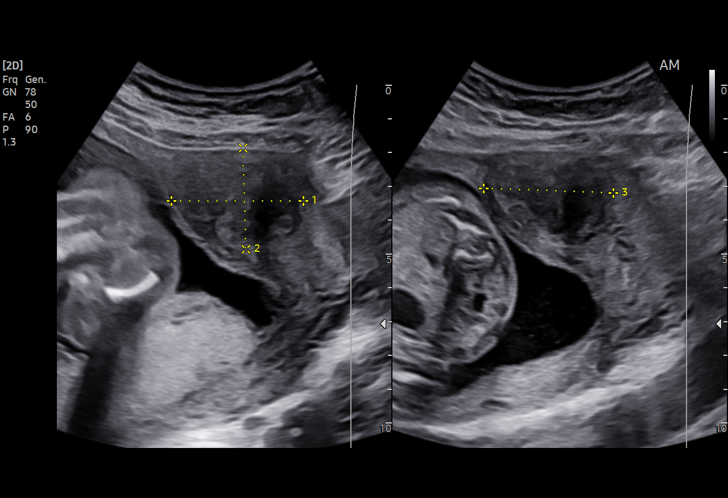
[im 29/109]
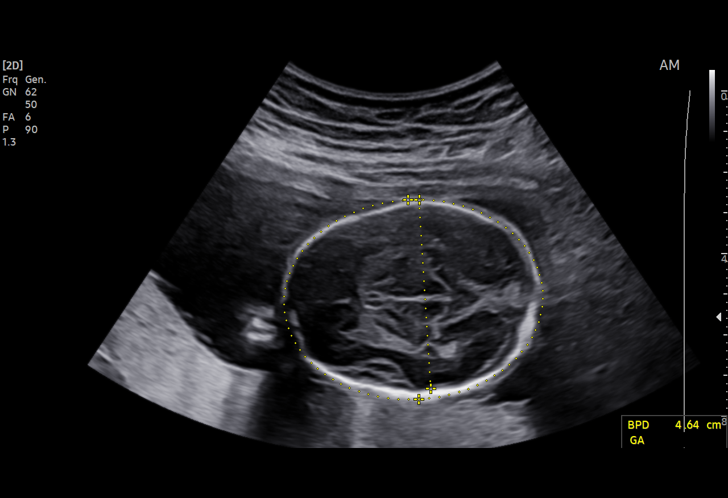
[im 37/109]
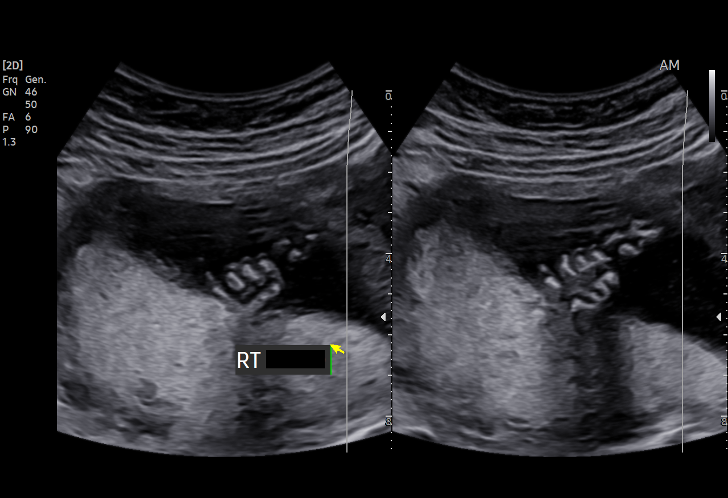
[im 45/109]
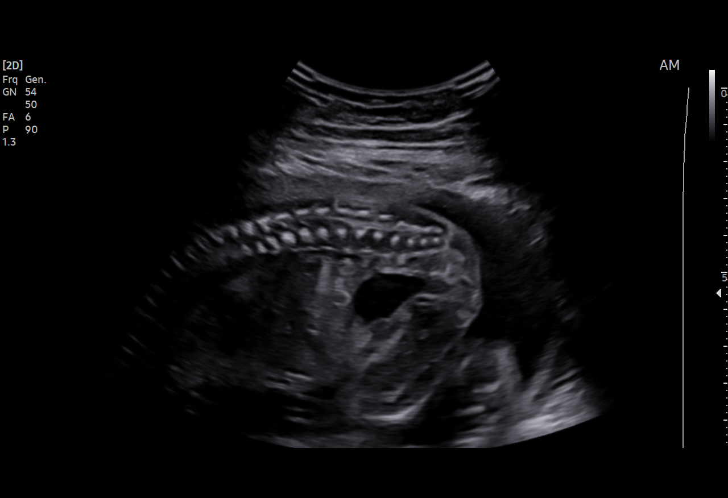
[im 57/109]
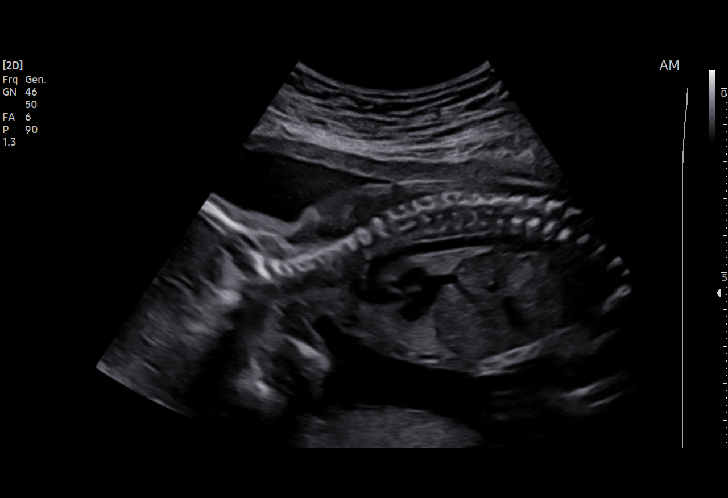
[im 65/109]
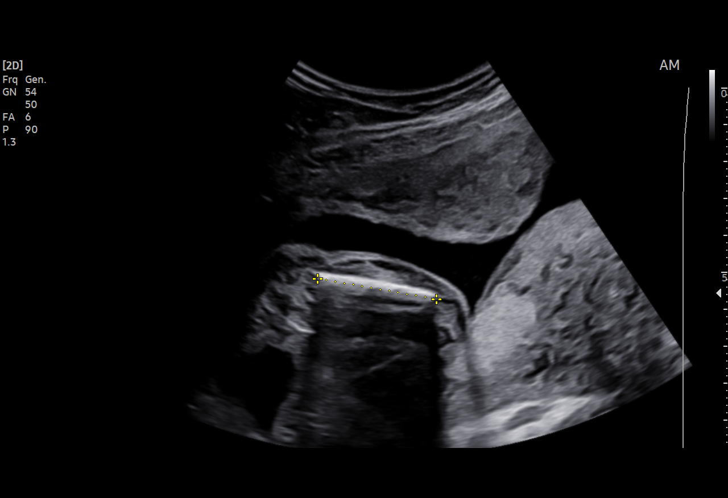
[im 73/109]
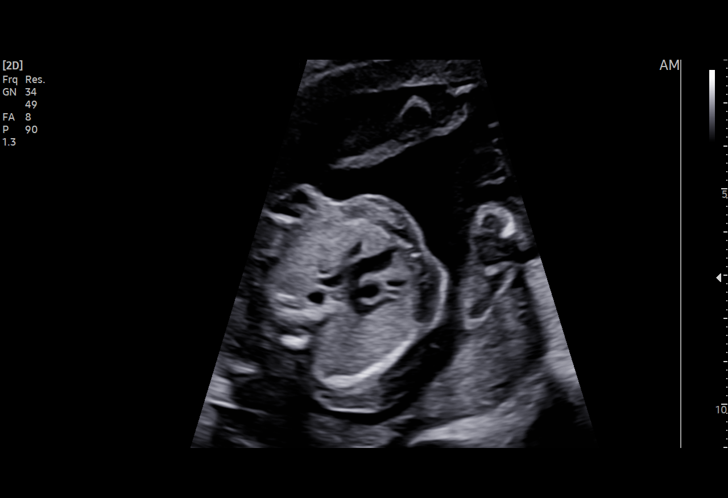
[im 81/109]
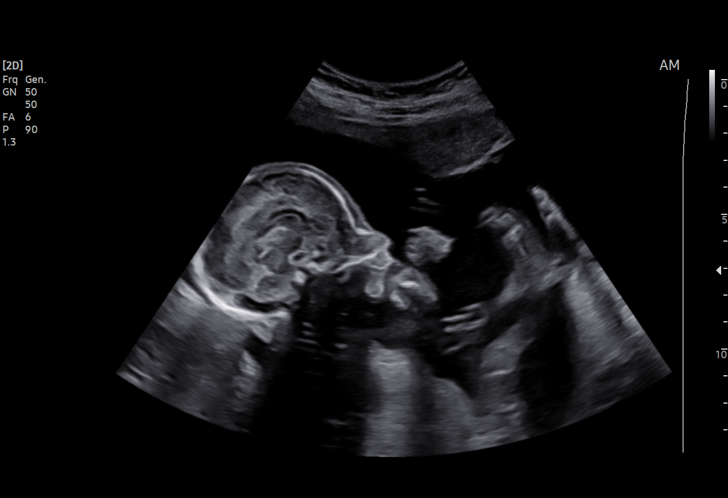
[im 89/109]
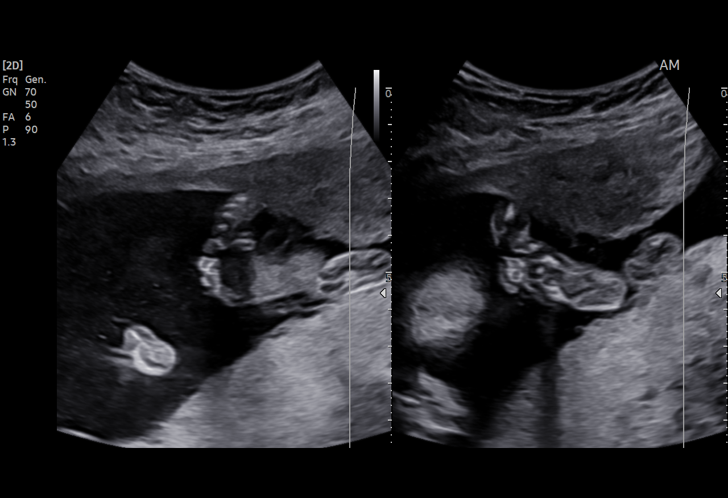
[im 97/109]
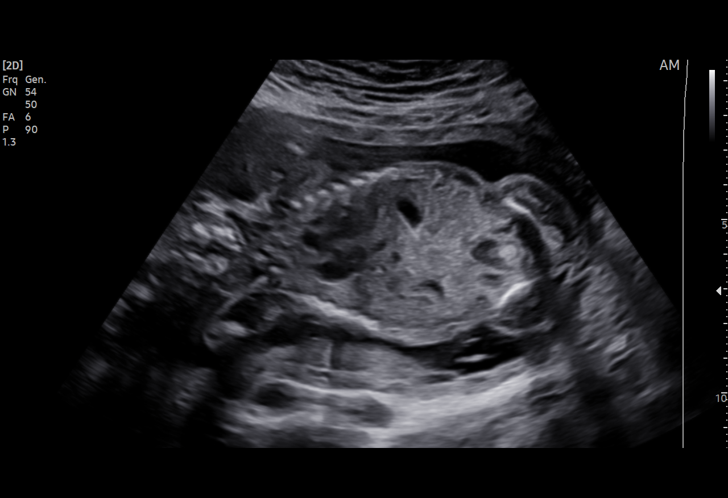
[im 105/109]
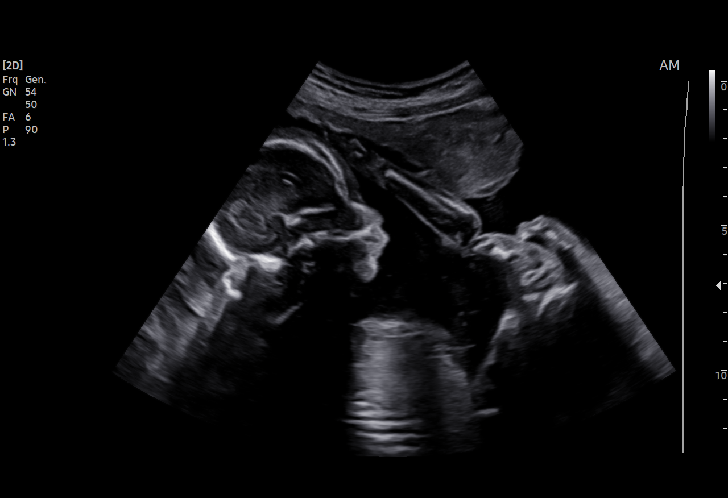

[13 of 28 positions shown; findings below may reference images not displayed]

FRYER CNM

                                                      MARVIN ALEXIS

Indications

 20 weeks gestation of pregnancy
 Uterine fibroids affecting pregnancy in         O34.12,
 second trimester, antepartum
 Antenatal screening for malformations
 LR NIPS
 Pre-G BMI 25
Fetal Evaluation

 Num Of Fetuses:          1
 Fetal Heart Rate(bpm):   144
 Cardiac Activity:        Observed
 Presentation:            Transverse, head to maternal right
 Placenta:                Posterior
 P. Cord Insertion:       Visualized

 Amniotic Fluid
 AFI FV:      Within normal limits
Biometry

 BPD:      47.7  mm     G. Age:  20w 3d         37  %    CI:        72.21   %    70 - 86
                                                         FL/HC:       17.6  %    15.9 -
 HC:      178.6  mm     G. Age:  20w 2d         24  %    HC/AC:       1.10       1.06 -
 AC:      162.5  mm     G. Age:  21w 2d         64  %    FL/BPD:      65.8  %
 FL:       31.4  mm     G. Age:  19w 5d         13  %    FL/AC:       19.3  %    20 - 24
 CER:      22.7  mm     G. Age:  21w 1d         79  %
 NFT:       3.4  mm
 LV:        4.5  mm
 CM:        6.9  mm

 Est. FW:     363   gm    0 lb 13 oz     37  %
OB History

 Gravidity:    1         Term:   0        Prem:   0        SAB:   0
 TOP:          0       Ectopic:  0        Living: 0
Gestational Age

 LMP:           20w 5d        Date:  10/02/21                  EDD:   07/09/22
 U/S Today:     20w 3d                                        EDD:   07/11/22
 Best:          20w 5d     Det. By:  LMP  (10/02/21)          EDD:   07/09/22
Anatomy

 Cranium:               Appears normal         Aortic Arch:            Appears normal
 Cavum:                 Appears normal         Ductal Arch:            Not well visualized
 Ventricles:            Appears normal         Diaphragm:              Appears normal
 Choroid Plexus:        Appears normal         Stomach:                Appears normal, left
                                                                       sided
 Cerebellum:            Appears normal         Abdomen:                Appears normal
 Posterior Fossa:       Appears normal         Abdominal Wall:         Appears nml (cord
                                                                       insert, abd wall)
 Nuchal Fold:           Appears normal         Cord Vessels:           Appears normal (3
                                                                       vessel cord)
 Face:                  Appears normal         Kidneys:                Appear normal
                        (orbits and profile)
 Lips:                  Appears normal         Bladder:                Appears normal
 Thoracic:              Appears normal         Spine:                  Appears normal
 Heart:                 Appears normal         Upper Extremities:      Appears normal
                        (4CH, axis, and
                        situs)
 RVOT:                  Appears normal         Lower Extremities:      Appears normal
 LVOT:                  Appears normal

 Other:  VC, 3VV and 3VTV visualized. Nasal bone, lenses, maxilla, mandible
         and falx visualized. Hands and feet visualized. Fetus appears to be a
         male.
Cervix Uterus Adnexa

 Cervix
 Length:           3.42  cm.
 Normal appearance by transabdominal scan.

 Uterus
 Multiple fibroids noted, see table below.

 Right Ovary
 Size(cm)     2.26   x   2.16   x  1.84      Vol(ml):

 Left Ovary
 Size(cm)     3.15   x   2.04   x  1.68      Vol(ml):
Myomas

 Site                     L(cm)      W(cm)       D(cm)      Location
 Posterior LUS
 Site                     L(cm)      W(cm)       D(cm)      Location
 Posterior fund RT
 Fundal mid
 Mid  Ant. LT

 Blood Flow                  RI       PI       Comments

Impression

 Follow up growth due to maternal fibrioids
 Normal interval growth with measurements consistent with
 dates
 Good fetal movement and amniotic fluid volume
Recommendations

 Repeat growth at 28 weeks as previously scheduled.

## 2024-04-17 ENCOUNTER — Ambulatory Visit
Admission: RE | Admit: 2024-04-17 | Discharge: 2024-04-17 | Disposition: A | Discharge status: Home / Self Care | Source: Ambulatory Visit | Attending: Certified Nurse Midwife | Admitting: Certified Nurse Midwife

## 2024-04-17 DIAGNOSIS — O3410 Maternal care for benign tumor of corpus uteri, unspecified trimester: Secondary | ICD-10-CM | POA: Insufficient documentation

## 2024-04-17 DIAGNOSIS — Z3A26 26 weeks gestation of pregnancy: Secondary | ICD-10-CM | POA: Diagnosis not present

## 2024-04-17 DIAGNOSIS — O321XX1 Maternal care for breech presentation, fetus 1: Secondary | ICD-10-CM | POA: Diagnosis not present

## 2024-04-17 DIAGNOSIS — O43199 Other malformation of placenta, unspecified trimester: Secondary | ICD-10-CM | POA: Diagnosis not present

## 2024-04-17 DIAGNOSIS — D259 Leiomyoma of uterus, unspecified: Secondary | ICD-10-CM | POA: Diagnosis not present

## 2024-04-19 ENCOUNTER — Other Ambulatory Visit

## 2024-04-19 ENCOUNTER — Other Ambulatory Visit: Payer: Self-pay | Admitting: Certified Nurse Midwife

## 2024-04-20 ENCOUNTER — Other Ambulatory Visit

## 2024-04-20 ENCOUNTER — Ambulatory Visit (INDEPENDENT_AMBULATORY_CARE_PROVIDER_SITE_OTHER): Admitting: Certified Nurse Midwife

## 2024-04-20 VITALS — BP 102/53 | HR 67 | Wt 174.6 lb

## 2024-04-20 DIAGNOSIS — N200 Calculus of kidney: Secondary | ICD-10-CM | POA: Diagnosis not present

## 2024-04-20 DIAGNOSIS — Z3480 Encounter for supervision of other normal pregnancy, unspecified trimester: Secondary | ICD-10-CM

## 2024-04-20 DIAGNOSIS — Z3A27 27 weeks gestation of pregnancy: Secondary | ICD-10-CM | POA: Diagnosis not present

## 2024-04-20 DIAGNOSIS — Z113 Encounter for screening for infections with a predominantly sexual mode of transmission: Secondary | ICD-10-CM

## 2024-04-20 DIAGNOSIS — O99891 Other specified diseases and conditions complicating pregnancy: Secondary | ICD-10-CM

## 2024-04-20 DIAGNOSIS — O43199 Other malformation of placenta, unspecified trimester: Secondary | ICD-10-CM

## 2024-04-20 DIAGNOSIS — Z131 Encounter for screening for diabetes mellitus: Secondary | ICD-10-CM

## 2024-04-20 DIAGNOSIS — Z114 Encounter for screening for human immunodeficiency virus [HIV]: Secondary | ICD-10-CM

## 2024-04-20 DIAGNOSIS — Z23 Encounter for immunization: Secondary | ICD-10-CM | POA: Diagnosis not present

## 2024-04-20 DIAGNOSIS — Z369 Encounter for antenatal screening, unspecified: Secondary | ICD-10-CM | POA: Diagnosis not present

## 2024-04-20 NOTE — Assessment & Plan Note (Signed)
 Kidney stone passed, stopped flomax .

## 2024-04-20 NOTE — Progress Notes (Signed)
    Return Prenatal Note   Subjective   [name] at [redacted]w[redacted]d presents for this follow-up prenatal visit.  Patient passed kidney stone since last visit, pain now resolved & no longer taking flomax . Patient reports: Movement: Present Contractions: Not present  Objective   Flow sheet Vitals: Pulse Rate: 67 BP: (!) 102/53 Fundal Height: 28 cm Fetal Heart Rate (bpm): 150 Total weight gain: 26 lb (11.8 kg)  General Appearance  No acute distress, well appearing, and well nourished Pulmonary   Normal work of breathing Neurologic   Alert and oriented to person, place, and time Psychiatric   Mood and affect within normal limits  Assessment/Plan   Plan  [name] at [redacted]w[redacted]d presents for follow-up OB visit. Reviewed prenatal record including previous visit note.  Supervision of other normal pregnancy, antepartum Reviewed kick counts and preterm labor warning signs. Instructed to call office or come to hospital with persistent headache, vision changes, regular contractions, leaking of fluid, decreased fetal movement or vaginal bleeding.  Kidney stone complicating pregnancy Kidney stone passed, stopped flomax .      Orders Placed This Encounter  Procedures   Tdap vaccine greater than or equal to 7yo IM   Comprehensive metabolic panel with GFR   Return in 2 weeks (on 05/04/2024) for ROB.   Future Appointments  Date Time Provider Department Center  05/09/2024  8:15 AM Dominic, Alva Jewels, CNM [facility] None  12/25/2024  1:20 PM Carollynn Cirri, NP [facility]    For next visit:  continue with routine prenatal care     Forestine Igo, CNM  06/19/252:03 PM

## 2024-04-20 NOTE — Assessment & Plan Note (Signed)
 Reviewed kick counts and preterm labor warning signs. Instructed to call office or come to hospital with persistent headache, vision changes, regular contractions, leaking of fluid, decreased fetal movement or vaginal bleeding.

## 2024-04-20 NOTE — Patient Instructions (Addendum)
 Tdap (Tetanus, Diphtheria, Pertussis) Vaccine: What You Need to Know Many vaccine information statements are available in Spanish and other languages. See PromoAge.com.br. 1. Why get vaccinated? Tdap vaccine can prevent tetanus, diphtheria, and pertussis. Diphtheria and pertussis spread from person to person. Tetanus enters the body through cuts or wounds. TETANUS (T) causes painful stiffening of the muscles. Tetanus can lead to serious health problems, including being unable to open the mouth, having trouble swallowing and breathing, or death. DIPHTHERIA (D) can lead to difficulty breathing, heart failure, paralysis, or death. PERTUSSIS (aP), also known as "whooping cough," can cause uncontrollable, violent coughing that makes it hard to breathe, eat, or drink. Pertussis can be extremely serious especially in babies and young children, causing pneumonia, convulsions, brain damage, or death. In teens and adults, it can cause weight loss, loss of bladder control, passing out, and rib fractures from severe coughing. 2. Tdap vaccine Tdap is only for children 7 years and older, adolescents, and adults.  Adolescents should receive a single dose of Tdap, preferably at age 1 or 12 years. Pregnant people should get a dose of Tdap during every pregnancy, preferably during the early part of the third trimester, to help protect the newborn from pertussis. Infants are most at risk for severe, life-threatening complications from pertussis. Adults who have never received Tdap should get a dose of Tdap. Also, adults should receive a booster dose of either Tdap or Td (a different vaccine that protects against tetanus and diphtheria but not pertussis) every 10 years, or after 5 years in the case of a severe or dirty wound or burn. Tdap may be given at the same time as other vaccines. 3. Talk with your health care provider Tell your vaccine provider if the person getting the vaccine: Has had an allergic  reaction after a previous dose of any vaccine that protects against tetanus, diphtheria, or pertussis, or has any severe, life-threatening allergies Has had a coma, decreased level of consciousness, or prolonged seizures within 7 days after a previous dose of any pertussis vaccine (DTP, DTaP, or Tdap) Has seizures or another nervous system problem Has ever had Guillain-Barr Syndrome (also called "GBS") Has had severe pain or swelling after a previous dose of any vaccine that protects against tetanus or diphtheria In some cases, your health care provider may decide to postpone Tdap vaccination until a future visit. People with minor illnesses, such as a cold, may be vaccinated. People who are moderately or severely ill should usually wait until they recover before getting Tdap vaccine.  Your health care provider can give you more information. 4. Risks of a vaccine reaction Pain, redness, or swelling where the shot was given, mild fever, headache, feeling tired, and nausea, vomiting, diarrhea, or stomachache sometimes happen after Tdap vaccination. People sometimes faint after medical procedures, including vaccination. Tell your provider if you feel dizzy or have vision changes or ringing in the ears.  As with any medicine, there is a very remote chance of a vaccine causing a severe allergic reaction, other serious injury, or death. 5. What if there is a serious problem? An allergic reaction could occur after the vaccinated person leaves the clinic. If you see signs of a severe allergic reaction (hives, swelling of the face and throat, difficulty breathing, a fast heartbeat, dizziness, or weakness), call 9-1-1 and get the person to the nearest hospital. For other signs that concern you, call your health care provider.  Adverse reactions should be reported to the Vaccine Adverse Event Reporting  System (VAERS). Your health care provider will usually file this report, or you can do it yourself. Visit the  VAERS website at www.vaers.LAgents.no or call 581-173-2486. VAERS is only for reporting reactions, and VAERS staff members do not give medical advice. 6. The National Vaccine Injury Compensation Program The [employer] Vaccine Injury Compensation Program (VICP) is a federal program that was created to compensate people who may have been injured by certain vaccines. Claims regarding alleged injury or death due to vaccination have a time limit for filing, which may be as short as two years. Visit the VICP website at SpiritualWord.at or call 807-029-5188 to learn about the program and about filing a claim. 7. How can I learn more? Ask your health care provider. Call your local or state health department. Visit the website of the Food and Drug Administration (FDA) for vaccine package inserts and additional information at FinderList.no. Contact the Centers for Disease Control and Prevention (CDC): Call 873-355-8522 (1-800-CDC-INFO) or Visit CDC's website at PicCapture.uy. Source: CDC Vaccine Information Statement Tdap (Tetanus, Diphtheria, Pertussis) Vaccine (06/07/2020) This same material is available at FootballExhibition.com.br for no charge. This information is not intended to replace advice given to you by your health care provider. Make sure you discuss any questions you have with your health care provider. Document Revised: 02/03/2023 Document Reviewed: 12/04/2022 Elsevier Patient Education  2024 [employer].   Third Trimester of Pregnancy  The third trimester of pregnancy is from week 28 through week 40. This is months 7 through 9. The third trimester is a time when your baby is growing fast. Body changes during your third trimester Your body continues to change during this time. The changes usually go away after your baby is born. Physical changes You will continue to gain weight. You may get stretch marks on your hips, belly, and breasts. Your  breasts will keep growing and may hurt. A yellow fluid (colostrum) may leak from your breasts. This is the first milk you're making for your baby. Your hair may grow faster and get thicker. In some cases, you may get hair loss. Your belly button may stick out. You may have more swelling in your hands, face, or ankles. Health changes You may have heartburn. You may feel short of breath. This is caused by the uterus that is now bigger. You may have more aches in the pelvis, back, or thighs. You may have more tingling or numbness in your hands, arms, and legs. You may pee more often. You may have trouble pooping (constipation) or swollen veins in the butt that can itch or get painful (hemorrhoids). Other changes You may have more problems sleeping. You may notice the baby moving lower in your belly (dropping). You may have more fluid coming from your vagina. Your joints may feel loose, and you may have pain around your pelvic bone. Follow these instructions at home: Medicines Take medicines only as told by your health care provider. Some medicines are not safe during pregnancy. Your provider may change the medicines that you take. Do not take any medicines unless told to by your provider. Take a prenatal vitamin that has at least 600 micrograms (mcg) of folic acid. Do not use herbal medicines, illegal drugs, or medicines that are not approved by your provider. Eating and drinking While you're pregnant your body needs additional nutrition to help support your growing baby. Talk with your provider about your nutritional needs. Activity Most women are able to exercise regularly during pregnancy. Exercise routines may need to  change at the end of your pregnancy. Talk to your provider about your activities and exercise routine. Relieving pain and discomfort Rest often with your legs raised if you have leg cramps or low back pain. Take warm sitz baths to soothe pain from hemorrhoids. Use  hemorrhoid cream if your provider says it's okay. Wear a good, supportive bra if your breasts hurt. Do not use hot tubs, steam rooms, or saunas. Do not douche. Do not use tampons or scented pads. Safety Talk to your provider before traveling far distances. Wear your seatbelt at all times when you're in a car. Talk to your provider if someone hits you, hurts you, or yells at you. Preparing for birth To prepare for your baby: Take childbirth and breastfeeding classes. Visit the hospital and tour the maternity area. Buy a rear-facing car seat. Learn how to install it in your car. General instructions Avoid cat litter boxes and soil used by cats. These things carry germs that can cause harm to your pregnancy and your baby. Do not drink alcohol, smoke, vape, or use products with nicotine or tobacco in them. If you need help quitting, talk with your provider. Keep all follow-up visits for your third trimester. Your provider will do more exams and tests during this trimester. Write down your questions. Take them to your prenatal visits. Your provider also will: Talk with you about your overall health. Give you advice or refer you to specialists who can help with different needs, including: Mental health and counseling. Foods and healthy eating. Ask for help if you need help with food. Where to find more information American Pregnancy Association: americanpregnancy.org [employer] of Obstetricians and Gynecologists: acog.org Office on [employer] Health: TravelLesson.ca Contact a health care provider if: You have a headache that does not go away when you take medicine. You have any of these problems: You can't eat or drink. You have nausea and vomiting. You have watery poop (diarrhea) for 2 days or more. You have pain when you pee, or your pee smells bad. You have been sick for 2 days or more and aren't getting better. Contact your provider right away if: You have any of these coming from  your vagina: Abnormal discharge. Bad-smelling fluid. Bleeding. Your baby is moving less than usual. You have signs of labor: You have any contractions, belly cramping, or have pain in your pelvis or lower back before 37 weeks of pregnancy (preterm labor). You have regular contractions that are less than 5 minutes apart. Your water breaks. You have symptoms of high blood pressure or preeclampsia. These include: A severe, throbbing headache that does not go away. Sudden or extreme swelling of your face, hands, legs, or feet. Vision problems: You see spots. You have blurry vision. Your eyes are sensitive to light. If you can't reach your provider, go to an urgent care or emergency room. Get help right away if: You faint, become confused, or can't think clearly. You have chest pain or trouble breathing. You have any kind of injury, such as from a fall or a car crash. These symptoms may be an emergency. Call 911 right away. Do not wait to see if the symptoms will go away. Do not drive yourself to the hospital. This information is not intended to replace advice given to you by your health care provider. Make sure you discuss any questions you have with your health care provider. Document Revised: 07/22/2023 Document Reviewed: 02/19/2023 Elsevier Patient Education  2024 [employer].

## 2024-04-21 LAB — 28 WEEK RH+PANEL
Basophils Absolute: 0 10*3/uL (ref 0.0–0.2)
Basos: 0 %
EOS (ABSOLUTE): 0.2 10*3/uL (ref 0.0–0.4)
Eos: 2 %
Gestational Diabetes Screen: 125 mg/dL (ref 70–139)
HIV Screen 4th Generation wRfx: NONREACTIVE
Hematocrit: 36.2 % (ref 34.0–46.6)
Hemoglobin: 12 g/dL (ref 11.1–15.9)
Immature Grans (Abs): 0.1 10*3/uL (ref 0.0–0.1)
Immature Granulocytes: 1 %
Lymphocytes Absolute: 1.6 10*3/uL (ref 0.7–3.1)
Lymphs: 16 %
MCH: 34.2 pg — ABNORMAL HIGH (ref 26.6–33.0)
MCHC: 33.1 g/dL (ref 31.5–35.7)
MCV: 103 fL — ABNORMAL HIGH (ref 79–97)
Monocytes Absolute: 0.5 10*3/uL (ref 0.1–0.9)
Monocytes: 5 %
Neutrophils Absolute: 7.5 10*3/uL — ABNORMAL HIGH (ref 1.4–7.0)
Neutrophils: 76 %
Platelets: 227 10*3/uL (ref 150–450)
RBC: 3.51 x10E6/uL — ABNORMAL LOW (ref 3.77–5.28)
RDW: 13.2 % (ref 11.7–15.4)
RPR Ser Ql: NONREACTIVE
WBC: 9.8 10*3/uL (ref 3.4–10.8)

## 2024-04-21 LAB — COMPREHENSIVE METABOLIC PANEL WITH GFR
ALT: 13 IU/L (ref 0–32)
AST: 14 IU/L (ref 0–40)
Albumin: 3.5 g/dL — ABNORMAL LOW (ref 4.0–5.0)
Alkaline Phosphatase: 66 IU/L (ref 44–121)
BUN/Creatinine Ratio: 14 (ref 9–23)
BUN: 7 mg/dL (ref 6–20)
Bilirubin Total: <0.2 mg/dL (ref 0.0–1.2)
CO2: 20 mmol/L (ref 20–29)
Calcium: 8.6 mg/dL — ABNORMAL LOW (ref 8.7–10.2)
Chloride: 102 mmol/L (ref 96–106)
Creatinine, Ser: 0.5 mg/dL — ABNORMAL LOW (ref 0.57–1.00)
Globulin, Total: 2.6 g/dL (ref 1.5–4.5)
Glucose: 130 mg/dL — ABNORMAL HIGH (ref 70–99)
Potassium: 3.5 mmol/L (ref 3.5–5.2)
Sodium: 135 mmol/L (ref 134–144)
Total Protein: 6.1 g/dL (ref 6.0–8.5)
eGFR: 129 mL/min/{1.73_m2} (ref >59)

## 2024-05-09 ENCOUNTER — Encounter: Payer: Self-pay | Admitting: Licensed Practical Nurse

## 2024-05-09 ENCOUNTER — Ambulatory Visit (INDEPENDENT_AMBULATORY_CARE_PROVIDER_SITE_OTHER): Admitting: Licensed Practical Nurse

## 2024-05-09 VITALS — BP 107/63 | HR 86 | Wt 178.5 lb

## 2024-05-09 DIAGNOSIS — Z3A30 30 weeks gestation of pregnancy: Secondary | ICD-10-CM | POA: Diagnosis not present

## 2024-05-09 DIAGNOSIS — Z3480 Encounter for supervision of other normal pregnancy, unspecified trimester: Secondary | ICD-10-CM

## 2024-05-09 NOTE — Assessment & Plan Note (Signed)
-   doing well, no concerns today.  - Works at [employer] on feet all day, reports some lower back pain and swelling in legs after work days. Recommended maternity belt and compression socks.  - Has a breast pump.  - not started thinking about birth plan, encouraged to.  - Reviewed PTL signs, warning signs reviewed. Fetal kick counts reviewed.  - R/O in 2 weeks

## 2024-05-09 NOTE — Progress Notes (Signed)
    Return Prenatal Note   Subjective   [name] at [redacted]w[redacted]d presents for this follow-up prenatal visit.  Patient doing well Patient reports: Movement: Present Contractions: Not present  Objective   Flow sheet Vitals: Pulse Rate: 86 BP: 107/63 Fundal Height: 30 cm Fetal Heart Rate (bpm): 140 Total weight gain: 13.6 kg  General Appearance  No acute distress, well appearing, and well nourished Pulmonary   Normal work of breathing Neurologic   Alert and oriented to person, place, and time Psychiatric   Mood and affect within normal limits  Assessment/Plan   Plan  [name] at [redacted]w[redacted]d presents for follow-up OB visit. Reviewed prenatal record including previous visit note.  Supervision of other normal pregnancy, antepartum - doing well, no concerns today.  - Works at [employer] on feet all day, reports some lower back pain and swelling in legs after work days. Recommended maternity belt and compression socks.  - Has a breast pump.  - not started thinking about birth plan, encouraged to.  - Reviewed PTL signs, warning signs reviewed. Fetal kick counts reviewed.  - R/O in 2 weeks      No orders of the defined types were placed in this encounter.  Return in about 2 weeks (around 05/23/2024) for ROB.   Future Appointments  Date Time Provider Department Center  05/23/2024  8:55 AM Jayne Harlene CROME, CNM [facility] None  12/25/2024  1:20 PM Antonette Angeline ORN, NP [facility]    For next visit:  continue with routine prenatal care     Daniella Dewberry Montero-Diaz, Student-MidWife  07/08/259:00 AM

## 2024-05-16 DIAGNOSIS — Z0289 Encounter for other administrative examinations: Secondary | ICD-10-CM

## 2024-05-19 ENCOUNTER — Telehealth: Payer: Self-pay

## 2024-05-19 NOTE — Telephone Encounter (Signed)
 I called Kim Oliver and left voicemail about her FMLA as I need a signature before faxing.

## 2024-05-23 ENCOUNTER — Ambulatory Visit (INDEPENDENT_AMBULATORY_CARE_PROVIDER_SITE_OTHER): Admitting: Certified Nurse Midwife

## 2024-05-23 ENCOUNTER — Encounter: Payer: Self-pay | Admitting: Certified Nurse Midwife

## 2024-05-23 VITALS — BP 103/57 | HR 68 | Wt 179.3 lb

## 2024-05-23 DIAGNOSIS — Z3480 Encounter for supervision of other normal pregnancy, unspecified trimester: Secondary | ICD-10-CM

## 2024-05-23 DIAGNOSIS — Z3A32 32 weeks gestation of pregnancy: Secondary | ICD-10-CM | POA: Diagnosis not present

## 2024-05-23 DIAGNOSIS — O43193 Other malformation of placenta, third trimester: Secondary | ICD-10-CM | POA: Diagnosis not present

## 2024-05-23 DIAGNOSIS — O43199 Other malformation of placenta, unspecified trimester: Secondary | ICD-10-CM

## 2024-05-23 NOTE — Progress Notes (Signed)
    Return Prenatal Note   Subjective   [name] at [redacted]w[redacted]d presents for this follow-up prenatal visit.  Patient feeling well, active baby. No recurrence of kidney stones. Patient reports: Movement: Present Contractions: Not present  Objective   Flow sheet Vitals: Pulse Rate: 68 BP: (!) 103/57 Fundal Height: 33 cm Fetal Heart Rate (bpm): 145 Total weight gain: 30 lb 11.2 oz (13.9 kg)  General Appearance  No acute distress, well appearing, and well nourished Pulmonary   Normal work of breathing Neurologic   Alert and oriented to person, place, and time Psychiatric   Mood and affect within normal limits  Assessment/Plan   Plan  [name] at [redacted]w[redacted]d presents for follow-up OB visit. Reviewed prenatal record including previous visit note.  Supervision of other normal pregnancy, antepartum Reviewed kick counts and preterm labor warning signs. Instructed to call office or come to hospital with persistent headache, vision changes, regular contractions, leaking of fluid, decreased fetal movement or vaginal bleeding. Link to [employer] for ordering support belt/compression socks through insurance sent via MyChart.  Marginal insertion of umbilical cord affecting management of mother Ultrasound for growth ordered.      Orders Placed This Encounter  Procedures   US  OB Follow Up    Standing Status:   Future    Expected Date:   06/06/2024    Expiration Date:   05/23/2025    Reason for exam::   marginal cord insertion, growth    Preferred imaging location?:   Internal   Return in 2 weeks (on 06/06/2024) for ROB, ultrasound for growth.   Future Appointments  Date Time Provider Department Center  12/25/2024  1:20 PM Antonette Angeline ORN, NP [facility]    For next visit:  continue with routine prenatal care     Harlene LITTIE Cisco, CNM  07/22/259:21 AM

## 2024-05-23 NOTE — Patient Instructions (Signed)
 Third Trimester of Pregnancy  The third trimester of pregnancy is from week 28 through week 40. This is months 7 through 9. The third trimester is a time when your baby is growing fast. Body changes during your third trimester Your body continues to change during this time. The changes usually go away after your baby is born. Physical changes You will continue to gain weight. You may get stretch marks on your hips, belly, and breasts. Your breasts will keep growing and may hurt. A yellow fluid (colostrum) may leak from your breasts. This is the first milk you're making for your baby. Your hair may grow faster and get thicker. In some cases, you may get hair loss. Your belly button may stick out. You may have more swelling in your hands, face, or ankles. Health changes You may have heartburn. You may feel short of breath. This is caused by the uterus that is now bigger. You may have more aches in the pelvis, back, or thighs. You may have more tingling or numbness in your hands, arms, and legs. You may pee more often. You may have trouble pooping (constipation) or swollen veins in the butt that can itch or get painful (hemorrhoids). Other changes You may have more problems sleeping. You may notice the baby moving lower in your belly (dropping). You may have more fluid coming from your vagina. Your joints may feel loose, and you may have pain around your pelvic bone. Follow these instructions at home: Medicines Take medicines only as told by your health care provider. Some medicines are not safe during pregnancy. Your provider may change the medicines that you take. Do not take any medicines unless told to by your provider. Take a prenatal vitamin that has at least 600 micrograms (mcg) of folic acid. Do not use herbal medicines, illegal drugs, or medicines that are not approved by your provider. Eating and drinking While you're pregnant your body needs additional nutrition to help  support your growing baby. Talk with your provider about your nutritional needs. Activity Most women are able to exercise regularly during pregnancy. Exercise routines may need to change at the end of your pregnancy. Talk to your provider about your activities and exercise routine. Relieving pain and discomfort Rest often with your legs raised if you have leg cramps or low back pain. Take warm sitz baths to soothe pain from hemorrhoids. Use hemorrhoid cream if your provider says it's okay. Wear a good, supportive bra if your breasts hurt. Do not use hot tubs, steam rooms, or saunas. Do not douche. Do not use tampons or scented pads. Safety Talk to your provider before traveling far distances. Wear your seatbelt at all times when you're in a car. Talk to your provider if someone hits you, hurts you, or yells at you. Preparing for birth To prepare for your baby: Take childbirth and breastfeeding classes. Visit the hospital and tour the maternity area. Buy a rear-facing car seat. Learn how to install it in your car. General instructions Avoid cat litter boxes and soil used by cats. These things carry germs that can cause harm to your pregnancy and your baby. Do not drink alcohol, smoke, vape, or use products with nicotine or tobacco in them. If you need help quitting, talk with your provider. Keep all follow-up visits for your third trimester. Your provider will do more exams and tests during this trimester. Write down your questions. Take them to your prenatal visits. Your provider also will: Talk with you about  your overall health. Give you advice or refer you to specialists who can help with different needs, including: Mental health and counseling. Foods and healthy eating. Ask for help if you need help with food. Where to find more information American Pregnancy Association: americanpregnancy.org [employer] of Obstetricians and Gynecologists: acog.org Office on [employer] Health:  TravelLesson.ca Contact a health care provider if: You have a headache that does not go away when you take medicine. You have any of these problems: You can't eat or drink. You have nausea and vomiting. You have watery poop (diarrhea) for 2 days or more. You have pain when you pee, or your pee smells bad. You have been sick for 2 days or more and aren't getting better. Contact your provider right away if: You have any of these coming from your vagina: Abnormal discharge. Bad-smelling fluid. Bleeding. Your baby is moving less than usual. You have signs of labor: You have any contractions, belly cramping, or have pain in your pelvis or lower back before 37 weeks of pregnancy (preterm labor). You have regular contractions that are less than 5 minutes apart. Your water breaks. You have symptoms of high blood pressure or preeclampsia. These include: A severe, throbbing headache that does not go away. Sudden or extreme swelling of your face, hands, legs, or feet. Vision problems: You see spots. You have blurry vision. Your eyes are sensitive to light. If you can't reach your provider, go to an urgent care or emergency room. Get help right away if: You faint, become confused, or can't think clearly. You have chest pain or trouble breathing. You have any kind of injury, such as from a fall or a car crash. These symptoms may be an emergency. Call 911 right away. Do not wait to see if the symptoms will go away. Do not drive yourself to the hospital. This information is not intended to replace advice given to you by your health care provider. Make sure you discuss any questions you have with your health care provider. Document Revised: 07/22/2023 Document Reviewed: 02/19/2023 Elsevier Patient Education  2024 [employer].

## 2024-05-23 NOTE — Assessment & Plan Note (Signed)
Ultrasound for growth ordered

## 2024-05-23 NOTE — Assessment & Plan Note (Addendum)
 Reviewed kick counts and preterm labor warning signs. Instructed to call office or come to hospital with persistent headache, vision changes, regular contractions, leaking of fluid, decreased fetal movement or vaginal bleeding. Link to [employer] for ordering support belt/compression socks through insurance sent via MyChart.

## 2024-06-12 ENCOUNTER — Ambulatory Visit (INDEPENDENT_AMBULATORY_CARE_PROVIDER_SITE_OTHER): Payer: Self-pay | Admitting: Certified Nurse Midwife

## 2024-06-12 ENCOUNTER — Ambulatory Visit (INDEPENDENT_AMBULATORY_CARE_PROVIDER_SITE_OTHER): Payer: PRIVATE HEALTH INSURANCE

## 2024-06-12 VITALS — BP 106/64 | HR 72 | Wt 180.8 lb

## 2024-06-12 DIAGNOSIS — O43193 Other malformation of placenta, third trimester: Secondary | ICD-10-CM | POA: Diagnosis not present

## 2024-06-12 DIAGNOSIS — Z3A35 35 weeks gestation of pregnancy: Secondary | ICD-10-CM

## 2024-06-12 DIAGNOSIS — Z3483 Encounter for supervision of other normal pregnancy, third trimester: Secondary | ICD-10-CM | POA: Diagnosis not present

## 2024-06-12 DIAGNOSIS — O43199 Other malformation of placenta, unspecified trimester: Secondary | ICD-10-CM

## 2024-06-12 NOTE — Progress Notes (Signed)
 ROB and u/s today for growth due to marginal cord insertion. Results reviewed with pt. All questions answered. Discussed Gbs testing next visitl.    Location: [facility] Date of Service: 06/12/2024  Ordering Provider: Harlene Cisco, CNM  Indications:Marginal cord insertion/Growth  Findings:  [name] intrauterine pregnancy is visualized with FHR at 143 bpm.   Biometrics gives an (U/S) Gestational age of [redacted]w[redacted]d and an (U/S) EDD of 07/16/24; this correlates with the clinically established Estimated Date of Delivery: 07/15/24.   Fetal presentation is Cephalic.  Placenta: anterior with marginal cord insertion again seen.  AFI: 20.06 cm  Growth is in the  44.8th percentile   AC is in the 53.2th percentile. EFW: 2613g, 5lb 12oz  Impression: 1. [redacted]w[redacted]d Viable Single Intrauterine pregnancy dated by previously established criteria. 2. Growth is 44.8th percentile. AFI is 20.06 cm.  3. Marginal cord insertion was again noted. 4. Uterine fibroids were again visualized. Recommendations: 1.Clinical correlation with the patient's History and Physical Exam. 2. Patient to have pelvic ultrasound once baby is born to evaluate uterine fibroids.  Kim Oliver, RDMS (AB,OB,BR),RVT

## 2024-06-22 ENCOUNTER — Ambulatory Visit (INDEPENDENT_AMBULATORY_CARE_PROVIDER_SITE_OTHER): Payer: PRIVATE HEALTH INSURANCE | Admitting: Advanced Practice Midwife

## 2024-06-22 ENCOUNTER — Encounter: Payer: Self-pay | Admitting: Advanced Practice Midwife

## 2024-06-22 ENCOUNTER — Other Ambulatory Visit (HOSPITAL_COMMUNITY)
Admission: RE | Admit: 2024-06-22 | Discharge: 2024-06-22 | Disposition: A | Discharge status: Home / Self Care | Payer: PRIVATE HEALTH INSURANCE | Source: Ambulatory Visit | Attending: Advanced Practice Midwife | Admitting: Advanced Practice Midwife

## 2024-06-22 VITALS — BP 92/66 | HR 63 | Wt 182.8 lb

## 2024-06-22 DIAGNOSIS — Z3685 Encounter for antenatal screening for Streptococcus B: Secondary | ICD-10-CM

## 2024-06-22 DIAGNOSIS — Z3A36 36 weeks gestation of pregnancy: Secondary | ICD-10-CM

## 2024-06-22 DIAGNOSIS — Z3483 Encounter for supervision of other normal pregnancy, third trimester: Secondary | ICD-10-CM | POA: Diagnosis not present

## 2024-06-22 DIAGNOSIS — Z3480 Encounter for supervision of other normal pregnancy, unspecified trimester: Secondary | ICD-10-CM

## 2024-06-22 DIAGNOSIS — Z113 Encounter for screening for infections with a predominantly sexual mode of transmission: Secondary | ICD-10-CM | POA: Insufficient documentation

## 2024-06-22 NOTE — Patient Instructions (Signed)
 Prueba de deteccin de estreptococos del grupo B durante el embarazo Group B Streptococcus Test During Pregnancy Por qu me debo realizar esta prueba? Se recomienda realizar pruebas de rutina, tambin llamadas pruebas de deteccin, para estreptococos del grupo B (EGB) entre la semana 36 y 37 de Psychiatrist. Los EGB pertenecen a un tipo de bacteria que puede transmitirse de la madre al beb durante el parto. Las pruebas de deteccin ayudarn a Chief Strategy Officer si usted Pension scheme manager o no tratamiento durante el [place] de parto y [place] para evitar complicaciones como: Una infeccin en el tero durante el [place] de parto. Una infeccin en el tero despus del parto. Una infeccin grave en el beb despus del parto, como neumona, meningitis o sepsis. En general, la prueba de deteccin de EGB no se realiza antes de las 36 [address] de [address], a menos que comience el trabajo de parto prematuramente. Qu sucede si tengo estreptococos del grupo B? Si las pruebas [employer] tiene EGB, Administrator tratamiento con antibiticos intravenosos durante el [place] de parto y [place]. Este tratamiento disminuye significativamente el riesgo de complicaciones para usted y el beb. Si tiene una cesrea planificada y tiene EGB, es posible que no necesite tratamiento con antibiticos porque los EGB se transmiten a los bebs generalmente despus de que comienza el [place] de parto y se rompe la bolsa de [place]. Si est en trabajo de parto o rompe la bolsa de aguas antes de la cesrea, es posible que los EGB se introduzcan en el tero y pasen al beb; en tal caso podra necesitar tratamiento. Existe la posibilidad de que no necesite hacerme pruebas? No es necesario que se le realice una prueba de deteccin de EGB si: Tiene un anlisis de orina que [place] EGB antes de la semana 36 a 37. Tuvo un beb con infeccin por EGB despus de un parto anterior. En [employer], se la tratar automticamente para los EGB  durante el [place] de parto y [place]. Qu se analiza? Esta prueba se realiza para verificar si tiene estreptococos del grupo B en la vagina o el recto. Qu tipo de [place] se toma? Para obtener muestras para esta prueba, Public librarian un exudado vaginal y rectal con un hisopo de algodn. Luego, la muestra se enva al laboratorio para determinar si hay EGB. Qu ocurre durante la prueba?  Usted se Chief Strategy Officer ropa de la cintura [place]. Deber acostarse en una camilla en la misma posicin que lo hara para un examen plvico. El Production manager un exudado vaginal y rectal con un hisopo de algodn para una prueba de cultivo. Podr irse a casa y realizar sus actividades habituales inmediatamente despus de los estudios. Cmo se informan los resultados? Los [employer] de la prueba se informan como positivos o negativos. Qu significan los resultados? Una prueba positiva significa que usted tiene riesgo de [employer] EGB al beb durante el [place] de parto y [place]. El Market researcher tratamiento con antibiticos intravenosos durante el [place] de parto y [place]. Una prueba con resultado negativo significa que tiene un riesgo muy bajo de transmitirle los EGB al beb. An existe un riesgo bajo de transmitir los EGB al beb porque a veces, los [employer] de la prueba pueden informar que usted no tiene una afeccin cuando realmente la tiene (resultado falso-negativo) o existe la posibilidad de que pueda infectarse con los EGB despus de la realizacin de la prueba. Es muy probable que no necesite tratamiento con  un antibitico durante el trabajo de parto y [place]. Hable con su mdico sobre lo que significan sus [place]. Preguntas para hacerle al mdico Consulte a su mdico o pregunte en el departamento donde se realiza la prueba acerca de lo siguiente: Cundo estarn disponibles mis resultados? Cmo obtendr mis resultados? Cules son las opciones de  tratamiento? Resumen Se recomienda a todas las mujeres embarazadas la realizacin de pruebas de rutina (pruebas de deteccin) para la deteccin de estreptococos del grupo B (EGB) entre la semana 36 y 37 de embarazo. Los EGB pertenecen a un tipo de bacteria que puede transmitirse de la madre al beb durante el parto. Si las pruebas [employer] tiene EGB, Administrator tratamiento con antibiticos intravenosos durante el [place] de parto y [place]. Este tratamiento casi siempre evita la infeccin en los recin nacidos. Esta informacin no tiene Theme park manager el consejo del mdico. Asegrese de hacerle al mdico cualquier pregunta que tenga. Document Revised: 12/21/2018 Document Reviewed: 10/05/2022 Elsevier Patient Education  2024 [employer].

## 2024-06-22 NOTE — Progress Notes (Signed)
 Routine Prenatal Care Visit  Subjective  Kim Oliver is a [name] at [redacted]w[redacted]d being seen today for ongoing prenatal care.  She is currently monitored for the following issues for this low-risk pregnancy and has Overweight with body mass index (BMI) of 25 to 25.9 in adult; Gastroesophageal reflux disease without esophagitis; Supervision of other normal pregnancy, antepartum; Marginal insertion of umbilical cord affecting management of mother; and Kidney stone complicating pregnancy on their problem list.  ----------------------------------------------------------------------------------- Patient reports no complaints.  Reviewed results of f/u growth from last week. Normal growth and AFI. Contractions: Not present. Vag. Bleeding: None.  Movement: Present. Leaking Fluid denies.  ----------------------------------------------------------------------------------- The following portions of the patient's history were reviewed and updated as appropriate: allergies, current medications, past family history, past medical history, past social history, past surgical history and problem list. Problem list updated.  Objective  BP 92/66   Pulse 63   Wt 182 lb 12.8 oz (82.9 kg)   LMP 10/09/2023   BMI 36.92 kg/m  Pregravid weight 148 lb 9.6 oz (67.4 kg) Total Weight Gain 34 lb 3.2 oz (15.5 kg) Urinalysis: Urine Protein    Urine Glucose    Fetal Status: Fetal Heart Rate (bpm): 157 Fundal Height: 36 cm Movement: Present      General:  Alert, oriented and cooperative. Patient is in no acute distress.  Skin: Skin is warm and dry. No rash noted.   Cardiovascular: Normal heart rate noted  Respiratory: Normal respiratory effort, no problems with respiration noted  Abdomen: Soft, gravid, appropriate for gestational age. Pain/Pressure: Absent     Pelvic:  Cervical exam deferred        Extremities: Normal range of motion.  Edema: None  Mental Status: Normal mood and affect. Normal behavior. Normal  judgment and thought content.   Assessment   [name] at [redacted]w[redacted]d by  07/15/2024, by Last Menstrual Period presenting for routine prenatal visit  Plan   G2 Problems (from 12/13/23 to present)     Problem Noted Diagnosed Resolved   Kidney stone complicating pregnancy 03/22/2024 by Jayne Harlene CROME, CNM  No   Supervision of other normal pregnancy, antepartum 01/04/2024 by Jayne Harlene CROME, CNM  No   Overview Addendum 05/09/2024  8:59 AM by Magdalena Saddler, Student-MidWife   Clinical Staff Provider  Office Location  [facility] Dating  07/15/2024, by Last Menstrual Period  Language  English Anatomy US     Flu Vaccine  01/04/24 Genetic Screen  NIPS: low risk, XX  RSV Vaccine      Covid Vaccine      TDaP vaccine   04/20/24 Hgb A1C or  GTT Early : Third trimester : 125  Covid x2   LAB RESULTS   Rhogam  O/Positive/-- (02/10 1216)  Blood Type O/Positive/-- (02/10 1216)   RSV  Antibody Negative (02/10 1216)  Feeding Plan breast Rubella 2.85 (02/10 1216)  Contraception BTL RPR Non Reactive (06/19 1009)   Circumcision N/a HBsAg Negative (02/10 1216)   Pediatrician  [facility] [place] HIV Non Reactive (06/19 1009)  Support Person Franky Varicella Reactive (02/10 1216)  Prenatal Classes  GBS  (For PCN allergy, check sensitivities)     Hep C Non Reactive (02/10 1216)   BTL Consent  Pap Diagnosis  Date Value Ref Range Status  12/01/2021   Final   - Negative for intraepithelial lesion or malignancy (NILM)    VBAC Consent NA Hgb Electro      CF  SMA                    Preterm labor symptoms and general obstetric precautions including but not limited to vaginal bleeding, contractions, leaking of fluid and fetal movement were reviewed in detail with the patient. Please refer to After Visit Summary for other counseling recommendations.   Return in about 1 week (around 06/29/2024) for rob.   Slater Rains, CNM [facility] 06/22/2024 4:48  PM

## 2024-06-24 LAB — STREP GP B NAA: Strep Gp B NAA: NEGATIVE

## 2024-06-26 ENCOUNTER — Encounter: Payer: Self-pay | Admitting: Certified Nurse Midwife

## 2024-06-26 ENCOUNTER — Ambulatory Visit: Payer: PRIVATE HEALTH INSURANCE | Admitting: Certified Nurse Midwife

## 2024-06-26 VITALS — BP 106/70 | HR 56 | Wt 183.1 lb

## 2024-06-26 DIAGNOSIS — O43193 Other malformation of placenta, third trimester: Secondary | ICD-10-CM | POA: Diagnosis not present

## 2024-06-26 DIAGNOSIS — Z3A37 37 weeks gestation of pregnancy: Secondary | ICD-10-CM | POA: Diagnosis not present

## 2024-06-26 DIAGNOSIS — O43199 Other malformation of placenta, unspecified trimester: Secondary | ICD-10-CM

## 2024-06-26 DIAGNOSIS — Z3480 Encounter for supervision of other normal pregnancy, unspecified trimester: Secondary | ICD-10-CM

## 2024-06-26 LAB — CERVICOVAGINAL ANCILLARY ONLY
Chlamydia: NEGATIVE
Comment: NEGATIVE
Comment: NORMAL
Neisseria Gonorrhea: NEGATIVE

## 2024-06-26 NOTE — Assessment & Plan Note (Signed)
 EFW 45%@35w 

## 2024-06-26 NOTE — Assessment & Plan Note (Signed)
 Reviewed labor warning signs and expectations for birth. Instructed to call office or come to hospital with persistent headache, vision changes, regular contractions, leaking of fluid, decreased fetal movement or vaginal bleeding. Encouraging labor handout sent via MyChart

## 2024-06-26 NOTE — Progress Notes (Signed)
    Return Prenatal Note   Subjective   [name] at [redacted]w[redacted]d presents for this follow-up prenatal visit.  Patient feeling well, some BH contractions, nothing rhythmic/regular. Reviewed normal growth on ultrasound 8/11, follow up fibroids after delivery Patient reports: Movement: Present Contractions: Irritability  Objective   Flow sheet Vitals: Pulse Rate: (!) 56 BP: 106/70 Fundal Height: 36 cm Fetal Heart Rate (bpm): 135 Presentation: Vertex Total weight gain: 34 lb 8 oz (15.6 kg)  General Appearance  No acute distress, well appearing, and well nourished Pulmonary   Normal work of breathing Neurologic   Alert and oriented to person, place, and time Psychiatric   Mood and affect within normal limits  Assessment/Plan   Plan  [name] at [redacted]w[redacted]d presents for follow-up OB visit. Reviewed prenatal record including previous visit note.  Supervision of other normal pregnancy, antepartum Reviewed labor warning signs and expectations for birth. Instructed to call office or come to hospital with persistent headache, vision changes, regular contractions, leaking of fluid, decreased fetal movement or vaginal bleeding. Encouraging labor handout sent via MyChart  Marginal insertion of umbilical cord affecting management of mother EFW 45%@35w       No orders of the defined types were placed in this encounter.  Return in 1 week (on 07/03/2024) for ROB.   Future Appointments  Date Time Provider Department Center  07/04/2024  2:35 PM Jayne Harlene CROME, CNM [facility] None  12/25/2024  1:20 PM Antonette Angeline ORN, NP [facility]    For next visit:  continue with routine prenatal care     Harlene CROME Jayne, CNM  08/25/259:18 AM

## 2024-06-26 NOTE — Patient Instructions (Signed)
 Third Trimester of Pregnancy  The third trimester of pregnancy is from week 28 through week 40. This is months 7 through 9. The third trimester is a time when your baby is growing fast. Body changes during your third trimester Your body continues to change during this time. The changes usually go away after your baby is born. Physical changes You will continue to gain weight. You may get stretch marks on your hips, belly, and breasts. Your breasts will keep growing and may hurt. A yellow fluid (colostrum) may leak from your breasts. This is the first milk you're making for your baby. Your hair may grow faster and get thicker. In some cases, you may get hair loss. Your belly button may stick out. You may have more swelling in your hands, face, or ankles. Health changes You may have heartburn. You may feel short of breath. This is caused by the uterus that is now bigger. You may have more aches in the pelvis, back, or thighs. You may have more tingling or numbness in your hands, arms, and legs. You may pee more often. You may have trouble pooping (constipation) or swollen veins in the butt that can itch or get painful (hemorrhoids). Other changes You may have more problems sleeping. You may notice the baby moving lower in your belly (dropping). You may have more fluid coming from your vagina. Your joints may feel loose, and you may have pain around your pelvic bone. Follow these instructions at home: Medicines Take medicines only as told by your health care provider. Some medicines are not safe during pregnancy. Your provider may change the medicines that you take. Do not take any medicines unless told to by your provider. Take a prenatal vitamin that has at least 600 micrograms (mcg) of folic acid. Do not use herbal medicines, illegal drugs, or medicines that are not approved by your provider. Eating and drinking While you're pregnant your body needs additional nutrition to help  support your growing baby. Talk with your provider about your nutritional needs. Activity Most women are able to exercise regularly during pregnancy. Exercise routines may need to change at the end of your pregnancy. Talk to your provider about your activities and exercise routine. Relieving pain and discomfort Rest often with your legs raised if you have leg cramps or low back pain. Take warm sitz baths to soothe pain from hemorrhoids. Use hemorrhoid cream if your provider says it's okay. Wear a good, supportive bra if your breasts hurt. Do not use hot tubs, steam rooms, or saunas. Do not douche. Do not use tampons or scented pads. Safety Talk to your provider before traveling far distances. Wear your seatbelt at all times when you're in a car. Talk to your provider if someone hits you, hurts you, or yells at you. Preparing for birth To prepare for your baby: Take childbirth and breastfeeding classes. Visit the hospital and tour the maternity area. Buy a rear-facing car seat. Learn how to install it in your car. General instructions Avoid cat litter boxes and soil used by cats. These things carry germs that can cause harm to your pregnancy and your baby. Do not drink alcohol, smoke, vape, or use products with nicotine or tobacco in them. If you need help quitting, talk with your provider. Keep all follow-up visits for your third trimester. Your provider will do more exams and tests during this trimester. Write down your questions. Take them to your prenatal visits. Your provider also will: Talk with you about  your overall health. Give you advice or refer you to specialists who can help with different needs, including: Mental health and counseling. Foods and healthy eating. Ask for help if you need help with food. Where to find more information American Pregnancy Association: americanpregnancy.org [employer] of Obstetricians and Gynecologists: acog.org Office on [employer] Health:  TravelLesson.ca Contact a health care provider if: You have a headache that does not go away when you take medicine. You have any of these problems: You can't eat or drink. You have nausea and vomiting. You have watery poop (diarrhea) for 2 days or more. You have pain when you pee, or your pee smells bad. You have been sick for 2 days or more and aren't getting better. Contact your provider right away if: You have any of these coming from your vagina: Abnormal discharge. Bad-smelling fluid. Bleeding. Your baby is moving less than usual. You have signs of labor: You have any contractions, belly cramping, or have pain in your pelvis or lower back before 37 weeks of pregnancy (preterm labor). You have regular contractions that are less than 5 minutes apart. Your water breaks. You have symptoms of high blood pressure or preeclampsia. These include: A severe, throbbing headache that does not go away. Sudden or extreme swelling of your face, hands, legs, or feet. Vision problems: You see spots. You have blurry vision. Your eyes are sensitive to light. If you can't reach your provider, go to an urgent care or emergency room. Get help right away if: You faint, become confused, or can't think clearly. You have chest pain or trouble breathing. You have any kind of injury, such as from a fall or a car crash. These symptoms may be an emergency. Call 911 right away. Do not wait to see if the symptoms will go away. Do not drive yourself to the hospital. This information is not intended to replace advice given to you by your health care provider. Make sure you discuss any questions you have with your health care provider. Document Revised: 07/22/2023 Document Reviewed: 02/19/2023 Elsevier Patient Education  2024 Elsevier Inc. Tercer trimestre de embarazo Third Trimester of Pregnancy  El tercer trimestre de embarazo va desde la semana 28 hasta la semana 40. Esto corresponde a los meses 7 a  9. El tercer trimestre es un perodo en el que el beb crece rpido. Cambios en el cuerpo durante el tercer trimestre Su cuerpo contina cambiando durante este perodo. En general, los cambios desaparecen despus del nacimiento del beb. Cambios fsicos Usted seguir aumentando de [place]. Podrn aparecer estras en las caderas, la barriga y las mamas. Las [employer] seguirn creciendo y pueden dolerle. Un lquido amarillo (calostro) puede salir de sus pechos. Esta es la primera leche que usted produce para el beb. El cabello puede crecerle ms rpido y engrosarse. En algunos casos, puede haber cada del cabello. El ombligo puede salir hacia afuera. Puede observar que se le hinchan ms las [address], la cara o los tobillos. Cambios en la salud Es posible que tenga acidez estomacal. Es posible que sienta que le falta el aire. La causa de esto es que el tero ahora es ms grande. Puede presentar ms dolor en la pelvis, la espalda o los muslos. Puede presentar ms hormigueo o entumecimiento en las manos, los brazos y las piernas. Es posible que orine con mayor frecuencia. Puede tener dificultad para defecar (estreimiento) o venas hinchadas en el ano que pueden picar o doler (hemorroides). Otros cambios Puede tener ms problemas para dormir.  Puede notar que el beb se mueve ms hacia bajo adentro de la barriga [facility]"). Puede ser que le salga ms lquido de la vagina. Puede sentir las articulaciones flojas y puede sentir dolor alrededor del hueso plvico. Siga estas instrucciones en su casa: Medicamentos Use los medicamentos solamente como se lo haya indicado el mdico. Algunos medicamentos no son seguros durante el embarazo. El mdico puede cambiar los medicamentos que usa . No use ningn medicamento a menos que se lo haya indicado el mdico. Tome vitaminas prenatales que tengan por lo menos 600 microgramos (mcg) de cido flico. No consuma medicamentos a base de hierbas, drogas ilegales, ni  medicamentos que el mdico no haya autorizado. Comida y bebida Durante el embarazo, [place] cuerpo necesita nutricin adicional para brindar sustento al beb que est creciendo. Hable con su mdico sobre sus necesidades nutricionales. Actividad La mayora de las mujeres pueden hacer ejercicio regularmente durante el embarazo. Las rutinas de ejercicio pueden tener que cambiar en la etapa final del [place]. Hable con su mdico sobre sus actividades y [place] de ejercicio. Alivio del dolor y del malestar Descanse a menudo y con las piernas elevadas si tiene Educational psychologist en las piernas o dolor de Patent attorney. Tome baos de asiento tibios para Engineer, materials de las hemorroides. Use una crema para las hemorroides si el mdico se lo permite. Use un sostn que le brinde buen soporte si le duelen las mamas. No se d baos de inmersin en agua caliente, baos turcos ni saunas. No se haga duchas vaginales. No use tampones ni protectores de ropa interior perfumados. Seguridad Hable con su mdico antes de viajar distancias largas. Use el cinturn de seguridad en todo momento mientras vaya en auto. Hable con su mdico si alguien la golpea, la lastima o le grita. Preparacin para el nacimiento Preparacin para recibir al beb: Tome clases para prepararse para el parto y Patent examiner. Visite el hospital y recorra el rea de maternidad. Compre un asiento de seguridad [employer] atrs para llevar al beb en el automvil. Aprenda cmo instalarlo en el auto. Instrucciones generales Evite el contacto con las bandejas sanitarias de los gatos y la tierra que estos animales usan. Estos objetos tienen grmenes que puedenperjudicar el embarazo y el beb. No beba alcohol, no fume, no vapee ni consuma productos que tengan nicotina o tabaco. Si necesita ayuda para dejar de fumar, hable con su mdico. Asista a todas las visitas de seguimiento del tercer trimestre. El mdico le har ms exmenes y estudios durante este  trimestre. Escriba sus preguntas. Llvelas cuando concurra a las visitas prenatales. El mdico tambin: Hablar con usted sobre su salud general. Ladora dar consejos o la derivar a especialistas que puedan ayudarla con diferentes necesidades, entre ellas: Salud mental y psicoterapia. Alimentos y alimentacin saludable. Si necesita ayuda con alimentos, pdala. Dnde buscar ms informacin American Pregnancy Association (Asociacin Americana del Embarazo): americanpregnancy.org [employer] of Obstetricians and Gynecologists (Colegio Estadounidense de Obstetras y Scientific laboratory technician): acog.org Office on [employer] (Oficina para la Salud de la Mujer): TravelLesson.ca Comunquese con un mdico si: Tiene un dolor de cabeza que no desaparece despus de Science writer. Tiene alguno de estos problemas: No puede comer ni beber. Tiene nuseas y vmitos. Hace deposiciones acuosas (diarrea) durante 2 o ms das. Siente dolor al orinar o hace orina con mal olor. Se ha sentido enferma durante 2 o ms das y no mejora. Comunquese con su mdico de inmediato si: Alguna de estas sustancias emana de la vagina: Secrecin anormal. Lquido con  mal olor. Sangrado. El beb se mueve menos de lo habitual. Presenta signos de [place] de parto: Tiene contracciones, clicos en la barriga o dolor en la pelvis o la parte baja de la espalda antes de las 37 semanas de embarazo (trabajo de parto prematuro). Tiene contracciones regulares separadas por menos de 5 minutos. Rompe la bolsa. Tiene sntomas de presin arterial alta o preeclampsia. Esto incluye lo siguiente: Dolor de [place] intenso y punzante que no desaparece. Hinchazn sbita o extrema del rostro, las manos, las piernas o los pies. Problemas de visin: Ve manchas. Tiene la visin borrosa. Los ojos tienen sensibilidad a Statistician. Si no puede comunicarse con su mdico, acuda a una sala de atencin de urgencias o emergencias. Solicite ayuda de inmediato  si: Se desmaya, se siente confundida o no puede pensar con claridad. Tiene dolor en el pecho o dificultad para respirar. Sufre cualquier tipo de lesin, por ejemplo, debido a una cada o un accidente automovilstico. Estos sntomas pueden indicar una emergencia. Llame al 911 de inmediato. No espere a ver si los sntomas desaparecen. No conduzca por sus propios medios [employer]. Esta informacin no tiene Theme park manager el consejo del mdico. Asegrese de hacerle al mdico cualquier pregunta que tenga. Document Revised: 03/25/2023 Document Reviewed: 03/25/2023 Elsevier Patient Education  2024 [employer].

## 2024-07-04 ENCOUNTER — Ambulatory Visit (INDEPENDENT_AMBULATORY_CARE_PROVIDER_SITE_OTHER): Payer: PRIVATE HEALTH INSURANCE | Admitting: Certified Nurse Midwife

## 2024-07-04 VITALS — BP 121/84 | HR 65 | Wt 176.4 lb

## 2024-07-04 DIAGNOSIS — Z3483 Encounter for supervision of other normal pregnancy, third trimester: Secondary | ICD-10-CM

## 2024-07-04 DIAGNOSIS — Z3A38 38 weeks gestation of pregnancy: Secondary | ICD-10-CM | POA: Diagnosis not present

## 2024-07-04 DIAGNOSIS — Z3480 Encounter for supervision of other normal pregnancy, unspecified trimester: Secondary | ICD-10-CM

## 2024-07-04 NOTE — Assessment & Plan Note (Signed)
 Reviewed labor warning signs and expectations for birth. Instructed to call office or come to hospital with persistent headache, vision changes, regular contractions, leaking of fluid, decreased fetal movement or vaginal bleeding.

## 2024-07-04 NOTE — Progress Notes (Signed)
    Return Prenatal Note   Subjective   [name] at [redacted]w[redacted]d presents for this follow-up prenatal visit.  Patient feeling well, having BH ctx but nothing rhythmic, regular. Would like cervical exam today. Patient reports: Movement: Present Contractions: Irritability  Objective   Flow sheet Vitals: Pulse Rate: 65 BP: 121/84 Fundal Height: 39 cm Fetal Heart Rate (bpm): 150 Presentation: Vertex Dilation: 1 Effacement (%): 30 Station: -3 Total weight gain: 27 lb 12.8 oz (12.6 kg)  General Appearance  No acute distress, well appearing, and well nourished Pulmonary   Normal work of breathing Neurologic   Alert and oriented to person, place, and time Psychiatric   Mood and affect within normal limits   Assessment/Plan   Plan  [name] at [redacted]w[redacted]d presents for follow-up OB visit. Reviewed prenatal record including previous visit note.  Supervision of other normal pregnancy, antepartum Reviewed labor warning signs and expectations for birth. Instructed to call office or come to hospital with persistent headache, vision changes, regular contractions, leaking of fluid, decreased fetal movement or vaginal bleeding.      No orders of the defined types were placed in this encounter.  Return in 1 week (on 07/11/2024) for ROB.   Future Appointments  Date Time Provider Department Center  07/10/2024  1:55 PM Jayne Harlene CROME, CNM [facility] None  12/25/2024  1:20 PM Antonette Angeline ORN, NP [facility] [address]    For next visit:  continue with routine prenatal care     Harlene CROME Jayne, CNM  09/02/254:16 PM

## 2024-07-07 NOTE — Patient Instructions (Addendum)
 Signs and Symptoms of Labor Labor is the body's natural process of moving the baby and the placenta out of the uterus. The process of labor usually starts when the baby is full-term, between 53 and 41 weeks of pregnancy. Signs and symptoms that you are close to going into labor As your body prepares for labor and the birth of your baby, you may notice the following symptoms in the weeks and days before true labor starts: Passing a small amount of thick, bloody mucus from your vagina. This is called normal bloody show or losing your mucus plug. This may happen more than a week before labor begins, or right before labor begins, as the opening of the cervix starts to widen (dilate). For some women, the entire mucus plug passes at once. For others, pieces of the mucus plug may gradually pass over several days. Your baby moving (dropping) lower in your pelvis to get into position for birth (lightening). When this happens, you may feel more pressure on your bladder and pelvic bone and less pressure on your ribs. This may make it easier to breathe. It may also cause you to need to urinate more often and have problems with bowel movements. Having practice contractions, also called Braxton Hicks contractions or false labor. These occur at irregular (unevenly spaced) intervals that are more than 10 minutes apart. False labor contractions are common after exercise or sexual activity. They will stop if you change position, rest, or drink fluids. These contractions are usually mild and do not get stronger over time. They may feel like: A backache or back pain. Mild cramps, similar to menstrual cramps. Tightening or pressure in your abdomen. Other early symptoms include: Nausea or loss of appetite. Diarrhea. Having a sudden burst of energy, or feeling very tired. Mood changes. Having trouble sleeping. Signs and symptoms that labor has begun Signs that you are in labor may include: Having contractions that come  at regular (evenly spaced) intervals and increase in intensity. This may feel like more intense tightening or pressure in your abdomen that moves to your back. Contractions may also feel like rhythmic pain in your upper thighs or back that comes and goes at regular intervals. If you are delivering for the first time, this change in intensity of contractions often occurs at a more gradual pace. If you have given birth before, you may notice a more rapid progression of contraction changes. Feeling pressure in the vaginal area. Your water breaking (rupture of membranes). This is when the sac of fluid that surrounds your baby breaks. Fluid leaking from your vagina may be clear or blood-tinged. Labor usually starts within 24 hours of your water breaking, but it may take longer to begin. Some people may feel a sudden gush of fluid; others may notice repeatedly damp underwear. Follow these instructions at home:  When labor starts, or if your water breaks, call your health care provider or nurse care line. Based on your situation, they will determine when you should go in for an exam. During early labor, you may be able to rest and manage symptoms at home. Some strategies to try at home include: Breathing and relaxation techniques. Taking a warm bath or shower. Listening to music. Using a heating pad on the lower back for pain. If directed, apply heat to the area as often as told by your health care provider. Use the heat source that your health care provider recommends, such as a moist heat pack or a heating pad. Place a  towel between your skin and the heat source. Leave the heat on for 20-30 minutes. Remove the heat if your skin turns bright red. This is especially important if you are unable to feel pain, heat, or cold. You have a greater risk of getting burned. Contact a health care provider if: Your labor has started. Your water breaks. You have nausea, vomiting, or diarrhea. Get help right away  if: You have painful, regular contractions that are 5 minutes apart or less. Labor starts before you are [redacted] weeks along in your pregnancy. You have a fever. You have bright red blood coming from your vagina. You do not feel your baby moving. You have a severe headache with or without vision problems. You have chest pain or shortness of breath. These symptoms may represent a serious problem that is an emergency. Do not wait to see if the symptoms will go away. Get medical help right away. Call your local emergency services (911 in the [place] Do not drive yourself to the hospital. Summary Labor is your body's natural process of moving your baby and the placenta out of your uterus. The process of labor usually starts when your baby is full-term, between 35 and 40 weeks of pregnancy. When labor starts, or if your water breaks, call your health care provider or nurse care line. Based on your situation, they will determine when you should go in for an exam. This information is not intended to replace advice given to you by your health care provider. Make sure you discuss any questions you have with your health care provider. Document Revised: 03/04/2021 Document Reviewed: 03/04/2021 Elsevier Patient Education  2024 [employer] Signs and Symptoms of Labor Labor is the body's natural process of moving the baby and the placenta out of the uterus. The process of labor usually starts when the baby is full-term, between 1 and 41 weeks of pregnancy. Signs and symptoms that you are close to going into labor As your body prepares for labor and the birth of your baby, you may notice the following symptoms in the weeks and days before true labor starts: Passing a small amount of thick, bloody mucus from your vagina. This is called normal bloody show or losing your mucus plug. This may happen more than a week before labor begins, or right before labor begins, as the opening of the cervix starts to widen  (dilate). For some women, the entire mucus plug passes at once. For others, pieces of the mucus plug may gradually pass over several days. Your baby moving (dropping) lower in your pelvis to get into position for birth (lightening). When this happens, you may feel more pressure on your bladder and pelvic bone and less pressure on your ribs. This may make it easier to breathe. It may also cause you to need to urinate more often and have problems with bowel movements. Having practice contractions, also called Braxton Hicks contractions or false labor. These occur at irregular (unevenly spaced) intervals that are more than 10 minutes apart. False labor contractions are common after exercise or sexual activity. They will stop if you change position, rest, or drink fluids. These contractions are usually mild and do not get stronger over time. They may feel like: A backache or back pain. Mild cramps, similar to menstrual cramps. Tightening or pressure in your abdomen. Other early symptoms include: Nausea or loss of appetite. Diarrhea. Having a sudden burst of energy, or feeling very tired. Mood changes. Having trouble sleeping. Signs and symptoms  that labor has begun Signs that you are in labor may include: Having contractions that come at regular (evenly spaced) intervals and increase in intensity. This may feel like more intense tightening or pressure in your abdomen that moves to your back. Contractions may also feel like rhythmic pain in your upper thighs or back that comes and goes at regular intervals. If you are delivering for the first time, this change in intensity of contractions often occurs at a more gradual pace. If you have given birth before, you may notice a more rapid progression of contraction changes. Feeling pressure in the vaginal area. Your water breaking (rupture of membranes). This is when the sac of fluid that surrounds your baby breaks. Fluid leaking from your vagina may be  clear or blood-tinged. Labor usually starts within 24 hours of your water breaking, but it may take longer to begin. Some people may feel a sudden gush of fluid; others may notice repeatedly damp underwear. Follow these instructions at home:  When labor starts, or if your water breaks, call your health care provider or nurse care line. Based on your situation, they will determine when you should go in for an exam. During early labor, you may be able to rest and manage symptoms at home. Some strategies to try at home include: Breathing and relaxation techniques. Taking a warm bath or shower. Listening to music. Using a heating pad on the lower back for pain. If directed, apply heat to the area as often as told by your health care provider. Use the heat source that your health care provider recommends, such as a moist heat pack or a heating pad. Place a towel between your skin and the heat source. Leave the heat on for 20-30 minutes. Remove the heat if your skin turns bright red. This is especially important if you are unable to feel pain, heat, or cold. You have a greater risk of getting burned. Contact a health care provider if: Your labor has started. Your water breaks. You have nausea, vomiting, or diarrhea. Get help right away if: You have painful, regular contractions that are 5 minutes apart or less. Labor starts before you are [redacted] weeks along in your pregnancy. You have a fever. You have bright red blood coming from your vagina. You do not feel your baby moving. You have a severe headache with or without vision problems. You have chest pain or shortness of breath. These symptoms may represent a serious problem that is an emergency. Do not wait to see if the symptoms will go away. Get medical help right away. Call your local emergency services (911 in the [place] Do not drive yourself to the hospital. Summary Labor is your body's natural process of moving your baby and the placenta out  of your uterus. The process of labor usually starts when your baby is full-term, between 63 and 40 weeks of pregnancy. When labor starts, or if your water breaks, call your health care provider or nurse care line. Based on your situation, they will determine when you should go in for an exam. This information is not intended to replace advice given to you by your health care provider. Make sure you discuss any questions you have with your health care provider. Document Revised: 03/04/2021 Document Reviewed: 03/04/2021 Elsevier Patient Education  2024 [employer].

## 2024-07-07 NOTE — Progress Notes (Signed)
    Return Prenatal Note   Subjective   [name] at [redacted]w[redacted]d presents for this follow-up prenatal visit.  Patient feeling well, ready to meet baby! Patient reports: Movement: Present Contractions: Irritability  Objective   Flow sheet Vitals: Pulse Rate: 69 BP: 107/61 Fundal Height: 38 cm Fetal Heart Rate (bpm): 140 Presentation: Vertex Dilation: 1.5 Effacement (%): 50 Station: -2 Total weight gain: 38 lb 12.8 oz (17.6 kg)  General Appearance  No acute distress, well appearing, and well nourished Pulmonary   Normal work of breathing Neurologic   Alert and oriented to person, place, and time Psychiatric   Mood and affect within normal limits   Assessment/Plan   Plan  [name] at [redacted]w[redacted]d presents for follow-up OB visit. Reviewed prenatal record including previous visit note.  Supervision of other normal pregnancy, antepartum Reviewed labor warning signs and expectations for birth. Instructed to call office or come to hospital with persistent headache, vision changes, regular contractions, leaking of fluid, decreased fetal movement or vaginal bleeding.      No orders of the defined types were placed in this encounter.  Return in 1 week (on 07/17/2024) for ROB & NST.   Future Appointments  Date Time Provider Department Center  07/18/2024  3:15 PM [facility] ROOM [facility] None  07/18/2024  3:35 PM Kim Oliver, CNM [facility] None  12/25/2024  1:20 PM Kim Angeline ORN, NP [facility] [address]    For next visit:  continue with routine prenatal care     Harlene Oliver Kim, CNM  09/08/255:26 PM

## 2024-07-10 ENCOUNTER — Ambulatory Visit (INDEPENDENT_AMBULATORY_CARE_PROVIDER_SITE_OTHER): Payer: PRIVATE HEALTH INSURANCE | Admitting: Certified Nurse Midwife

## 2024-07-10 VITALS — BP 107/61 | HR 69 | Wt 187.4 lb

## 2024-07-10 DIAGNOSIS — Z3A39 39 weeks gestation of pregnancy: Secondary | ICD-10-CM

## 2024-07-10 DIAGNOSIS — Z3480 Encounter for supervision of other normal pregnancy, unspecified trimester: Secondary | ICD-10-CM

## 2024-07-10 DIAGNOSIS — Z3483 Encounter for supervision of other normal pregnancy, third trimester: Secondary | ICD-10-CM | POA: Diagnosis not present

## 2024-07-10 NOTE — Assessment & Plan Note (Signed)
 Reviewed labor warning signs and expectations for birth. Instructed to call office or come to hospital with persistent headache, vision changes, regular contractions, leaking of fluid, decreased fetal movement or vaginal bleeding.

## 2024-07-18 ENCOUNTER — Other Ambulatory Visit

## 2024-07-18 ENCOUNTER — Ambulatory Visit (INDEPENDENT_AMBULATORY_CARE_PROVIDER_SITE_OTHER): Payer: PRIVATE HEALTH INSURANCE | Admitting: Certified Nurse Midwife

## 2024-07-18 VITALS — BP 111/74 | HR 70 | Wt 185.5 lb

## 2024-07-18 DIAGNOSIS — Z3A40 40 weeks gestation of pregnancy: Secondary | ICD-10-CM | POA: Diagnosis not present

## 2024-07-18 DIAGNOSIS — O480 Post-term pregnancy: Secondary | ICD-10-CM

## 2024-07-18 DIAGNOSIS — Z3483 Encounter for supervision of other normal pregnancy, third trimester: Secondary | ICD-10-CM

## 2024-07-18 NOTE — Patient Instructions (Signed)
 Signs and Symptoms of Labor Labor is the body's natural process of moving the baby and the placenta out of the uterus. The process of labor usually starts when the baby is full-term, between 74 and 41 weeks of pregnancy. Signs and symptoms that you are close to going into labor As your body prepares for labor and the birth of your baby, you may notice the following symptoms in the weeks and days before true labor starts: Passing a small amount of thick, bloody mucus from your vagina. This is called normal bloody show or losing your mucus plug. This may happen more than a week before labor begins, or right before labor begins, as the opening of the cervix starts to widen (dilate). For some women, the entire mucus plug passes at once. For others, pieces of the mucus plug may gradually pass over several days. Your baby moving (dropping) lower in your pelvis to get into position for birth (lightening). When this happens, you may feel more pressure on your bladder and pelvic bone and less pressure on your ribs. This may make it easier to breathe. It may also cause you to need to urinate more often and have problems with bowel movements. Having "practice contractions," also called Braxton Hicks contractions or false labor. These occur at irregular (unevenly spaced) intervals that are more than 10 minutes apart. False labor contractions are common after exercise or sexual activity. They will stop if you change position, rest, or drink fluids. These contractions are usually mild and do not get stronger over time. They may feel like: A backache or back pain. Mild cramps, similar to menstrual cramps. Tightening or pressure in your abdomen. Other early symptoms include: Nausea or loss of appetite. Diarrhea. Having a sudden burst of energy, or feeling very tired. Mood changes. Having trouble sleeping. Signs and symptoms that labor has begun Signs that you are in labor may include: Having contractions that come  at regular (evenly spaced) intervals and increase in intensity. This may feel like more intense tightening or pressure in your abdomen that moves to your back. Contractions may also feel like rhythmic pain in your upper thighs or back that comes and goes at regular intervals. If you are delivering for the first time, this change in intensity of contractions often occurs at a more gradual pace. If you have given birth before, you may notice a more rapid progression of contraction changes. Feeling pressure in the vaginal area. Your water breaking (rupture of membranes). This is when the sac of fluid that surrounds your baby breaks. Fluid leaking from your vagina may be clear or blood-tinged. Labor usually starts within 24 hours of your water breaking, but it may take longer to begin. Some people may feel a sudden gush of fluid; others may notice repeatedly damp underwear. Follow these instructions at home:  When labor starts, or if your water breaks, call your health care provider or nurse care line. Based on your situation, they will determine when you should go in for an exam. During early labor, you may be able to rest and manage symptoms at home. Some strategies to try at home include: Breathing and relaxation techniques. Taking a warm bath or shower. Listening to music. Using a heating pad on the lower back for pain. If directed, apply heat to the area as often as told by your health care provider. Use the heat source that your health care provider recommends, such as a moist heat pack or a heating pad. Place a  towel between your skin and the heat source. Leave the heat on for 20-30 minutes. Remove the heat if your skin turns bright red. This is especially important if you are unable to feel pain, heat, or cold. You have a greater risk of getting burned. Contact a health care provider if: Your labor has started. Your water breaks. You have nausea, vomiting, or diarrhea. Get help right away  if: You have painful, regular contractions that are 5 minutes apart or less. Labor starts before you are [redacted] weeks along in your pregnancy. You have a fever. You have bright red blood coming from your vagina. You do not feel your baby moving. You have a severe headache with or without vision problems. You have chest pain or shortness of breath. These symptoms may represent a serious problem that is an emergency. Do not wait to see if the symptoms will go away. Get medical help right away. Call your local emergency services (911 in the [place] Do not drive yourself to the hospital. Summary Labor is your body's natural process of moving your baby and the placenta out of your uterus. The process of labor usually starts when your baby is full-term, between 25 and 40 weeks of pregnancy. When labor starts, or if your water breaks, call your health care provider or nurse care line. Based on your situation, they will determine when you should go in for an exam. This information is not intended to replace advice given to you by your health care provider. Make sure you discuss any questions you have with your health care provider. Document Revised: 03/04/2021 Document Reviewed: 03/04/2021 Elsevier Patient Education  2024 [employer].

## 2024-07-18 NOTE — Progress Notes (Unsigned)
    Return Prenatal Note   Subjective   [name] at [redacted]w[redacted]d presents for this follow-up prenatal visit.  Patient feeling well, ready for labor. PDIOL scheduled. Patient reports: Movement: Present Contractions: Irregular  Objective   Flow sheet Vitals: Pulse Rate: 70 BP: 111/74 Total weight gain: 36 lb 14.4 oz (16.7 kg)  General Appearance  No acute distress, well appearing, and well nourished Pulmonary   Normal work of breathing Neurologic   Alert and oriented to person, place, and time Psychiatric   Mood and affect within normal limits  Fetus A Non-Stress Test Interpretation for 07/21/24  Indication: Postdates  Fetal Heart Rate A Baseline Rate (A): 145 bpm Variability: Moderate Accelerations: 15 x 15 Decelerations: None  Uterine Activity Mode: Toco Contraction Frequency (min): 10 Contraction Duration (sec): 45-60 Contraction Quality: Mild Resting Tone Palpated: Relaxed Resting Time: Adequate  Interpretation (Fetal Testing) Overall Impression: Reassuring for gestational age    Assessment/Plan   Plan  [name] at [redacted]w[redacted]d presents for follow-up OB visit. Reviewed prenatal record including previous visit note.  Labor precautions reviewed. PDIOL scheduled for 9/17 11am     Orders Placed This Encounter  Procedures   Fetal nonstress test    Standing Status:   Future    Expiration Date:   07/18/2025   No follow-ups on file.   Future Appointments  Date Time Provider Department Center  12/25/2024  1:20 PM Antonette Angeline ORN, NP [facility] [address]    For next visit:  continue with routine prenatal care     Harlene LITTIE Cisco, CNM  09/16/253:58 PM

## 2024-07-19 ENCOUNTER — Encounter: Payer: Self-pay | Admitting: Obstetrics

## 2024-07-19 ENCOUNTER — Inpatient Hospital Stay
Admission: EM | Admit: 2024-07-19 | Discharge: 2024-07-20 | DRG: 776 | Disposition: A | Discharge status: Home / Self Care | Attending: Certified Nurse Midwife | Admitting: Certified Nurse Midwife

## 2024-07-19 ENCOUNTER — Other Ambulatory Visit: Payer: Self-pay

## 2024-07-19 DIAGNOSIS — O480 Post-term pregnancy: Secondary | ICD-10-CM

## 2024-07-19 DIAGNOSIS — Z23 Encounter for immunization: Secondary | ICD-10-CM | POA: Diagnosis not present

## 2024-07-19 DIAGNOSIS — Z3A40 40 weeks gestation of pregnancy: Secondary | ICD-10-CM

## 2024-07-19 DIAGNOSIS — R531 Weakness: Secondary | ICD-10-CM | POA: Diagnosis not present

## 2024-07-19 DIAGNOSIS — Z3490 Encounter for supervision of normal pregnancy, unspecified, unspecified trimester: Principal | ICD-10-CM

## 2024-07-19 LAB — CBC
HCT: 33.6 % — ABNORMAL LOW (ref 36.0–46.0)
Hemoglobin: 11.7 g/dL — ABNORMAL LOW (ref 12.0–15.0)
MCH: 33.5 pg (ref 26.0–34.0)
MCHC: 34.8 g/dL (ref 30.0–36.0)
MCV: 96.3 fL (ref 80.0–100.0)
Platelets: 210 K/uL (ref 150–400)
RBC: 3.49 MIL/uL — ABNORMAL LOW (ref 3.87–5.11)
RDW: 12.9 % (ref 11.5–15.5)
WBC: 17.8 K/uL — ABNORMAL HIGH (ref 4.0–10.5)
nRBC: 0 % (ref 0.0–0.2)

## 2024-07-19 LAB — TYPE AND SCREEN
ABO/RH(D): O POS
Antibody Screen: NEGATIVE

## 2024-07-19 MED ORDER — ACETAMINOPHEN 500 MG PO TABS: 1000.0000 mg | ORAL_TABLET | Freq: Four times a day (QID) | ORAL | Status: DC | PRN

## 2024-07-19 MED ORDER — DIPHENHYDRAMINE HCL 25 MG PO CAPS: 25.0000 mg | ORAL_CAPSULE | Freq: Four times a day (QID) | ORAL | Status: DC | PRN

## 2024-07-19 MED ORDER — DOCUSATE SODIUM 100 MG PO CAPS
100.0000 mg | ORAL_CAPSULE | Freq: Two times a day (BID) | ORAL | Status: DC
  Administered 2024-07-20: 100 mg via ORAL
  Filled 2024-07-19: qty 1

## 2024-07-19 MED ORDER — ONDANSETRON HCL 4 MG/2ML IJ SOLN: 4.0000 mg | INTRAMUSCULAR | Status: DC | PRN

## 2024-07-19 MED ORDER — ZOLPIDEM TARTRATE 5 MG PO TABS: 5.0000 mg | ORAL_TABLET | Freq: Every evening | ORAL | Status: DC | PRN

## 2024-07-19 MED ORDER — IBUPROFEN 600 MG PO TABS: 600.0000 mg | ORAL_TABLET | Freq: Once | ORAL | Status: DC

## 2024-07-19 MED ORDER — COCONUT OIL OIL: 1.0000 | TOPICAL_OIL | Status: DC | PRN

## 2024-07-19 MED ORDER — PRENATAL MULTIVITAMIN CH
1.0000 | ORAL_TABLET | Freq: Every day | ORAL | Status: DC
  Administered 2024-07-19: 1 via ORAL
  Filled 2024-07-19 (×2): qty 1

## 2024-07-19 MED ORDER — SIMETHICONE 80 MG PO CHEW: 80.0000 mg | CHEWABLE_TABLET | ORAL | Status: DC | PRN

## 2024-07-19 MED ORDER — WITCH HAZEL-GLYCERIN EX PADS
1.0000 | MEDICATED_PAD | CUTANEOUS | Status: DC | PRN
  Filled 2024-07-19: qty 100

## 2024-07-19 MED ORDER — ONDANSETRON HCL 4 MG PO TABS: 4.0000 mg | ORAL_TABLET | ORAL | Status: DC | PRN

## 2024-07-19 MED ORDER — TETANUS-DIPHTH-ACELL PERTUSSIS 5-2.5-18.5 LF-MCG/0.5 IM SUSY
0.5000 mL | PREFILLED_SYRINGE | Freq: Once | INTRAMUSCULAR | Status: DC
  Filled 2024-07-19: qty 0.5

## 2024-07-19 MED ORDER — IBUPROFEN 600 MG PO TABS
600.0000 mg | ORAL_TABLET | Freq: Four times a day (QID) | ORAL | Status: DC
  Administered 2024-07-19: 600 mg via ORAL
  Filled 2024-07-19 (×3): qty 1

## 2024-07-19 MED ORDER — BENZOCAINE-MENTHOL 20-0.5 % EX AERO
1.0000 | INHALATION_SPRAY | CUTANEOUS | Status: DC | PRN
  Filled 2024-07-19: qty 56

## 2024-07-19 MED ORDER — INFLUENZA VIRUS VACC SPLIT PF (FLUZONE) 0.5 ML IM SUSY: 0.5000 mL | PREFILLED_SYRINGE | INTRAMUSCULAR | Status: DC | PRN

## 2024-07-19 MED ORDER — DIBUCAINE (PERIANAL) 1 % EX OINT
1.0000 | TOPICAL_OINTMENT | CUTANEOUS | Status: DC | PRN
  Filled 2024-07-19: qty 28

## 2024-07-19 NOTE — Discharge Summary (Signed)
 Postpartum Discharge Summary  Date of Service updated***     Patient Name: Kim Oliver DOB: 09-29-1991 MRN: [id number]  Date of admission: 07/19/2024 Delivery date:07/19/2024 Delivering provider: JAYNE HARLENE CROME Date of discharge: 07/19/2024  Admitting diagnosis: Pregnancy [Z34.90] Postpartum care following vaginal delivery [Z39.2] Intrauterine pregnancy: [redacted]w[redacted]d     Secondary diagnosis:  Principal Problem:   Pregnancy Active Problems:   Precipitate labor, delivered, current hospitalization   Vaginal delivery   Postpartum care following vaginal delivery  Additional problems: ***    Discharge diagnosis: {DX.:23714}                                              Post partum procedures:{Postpartum procedures:23558} Augmentation: {Augmentation:20782} Complications: {OB Labor/Delivery Complications:20784}  Hospital course: {Courses:23701}  Magnesium Sulfate received: {Mag received:30440022} BMZ received: {BMZ received:30440023} Rhophylac:{Rhophylac received:30440032} FFM:{FFM:69559966} T-DaP:{Tdap:23962} Flu: {Qol:76036} RSV Vaccine received: {RSV:31013} Transfusion:{Transfusion received:30440034} Immunizations administered: Immunization History  Administered Date(s) Administered   Influenza, Seasonal, Injecte, Preservative Fre 01/04/2024   Influenza,inj,Quad PF,6+ Mos 10/01/2022   Tdap 11/16/2020, 04/16/2022, 04/20/2024    Physical exam  Vitals:   07/19/24 1200 07/19/24 1409 07/19/24 1552 07/19/24 2003  BP: 116/65 118/74 (!) 103/59 101/62  Pulse: 70 65 69 64  Resp: 18 18 18 18   Temp: 98 F (36.7 C) 98.6 F (37 C) 98.2 F (36.8 C) (!) 97.4 F (36.3 C)  TempSrc: Oral Oral Oral Oral  SpO2:    100%   General: {Exam; general:21111117} Lochia: {Desc; appropriate/inappropriate:30686::appropriate} Uterine Fundus: {Desc; firm/soft:30687} Incision: {Exam; incision:21111123} DVT Evaluation: {Exam; dvt:2111122} Labs: Lab Results  Component Value Date    WBC 17.8 (H) 07/19/2024   HGB 11.7 (L) 07/19/2024   HCT 33.6 (L) 07/19/2024   MCV 96.3 07/19/2024   PLT 210 07/19/2024      Latest Ref Rng & Units 04/20/2024   10:09 AM  CMP  Glucose 70 - 99 mg/dL 869   BUN 6 - 20 mg/dL 7   Creatinine 9.42 - 8.99 mg/dL 9.49   Sodium 865 - 855 mmol/L 135   Potassium 3.5 - 5.2 mmol/L 3.5   Chloride 96 - 106 mmol/L 102   CO2 20 - 29 mmol/L 20   Calcium 8.7 - 10.2 mg/dL 8.6   Total Protein 6.0 - 8.5 g/dL 6.1   Total Bilirubin 0.0 - 1.2 mg/dL <9.7   Alkaline Phos 44 - 121 IU/L 66   AST 0 - 40 IU/L 14   ALT 0 - 32 IU/L 13    Edinburgh Score:    07/19/2024   12:00 PM  Edinburgh Postnatal Depression Scale Screening Tool  I have been able to laugh and see the funny side of things. 0  I have looked forward with enjoyment to things. 0  I have blamed myself unnecessarily when things went wrong. 0  I have been anxious or worried for no good reason. 0  I have felt scared or panicky for no good reason. 0  Things have been getting on top of me. 1  I have been so unhappy that I have had difficulty sleeping. 0  I have felt sad or miserable. 0  I have been so unhappy that I have been crying. 0  The thought of harming myself has occurred to me. 0  Edinburgh Postnatal Depression Scale Total 1      After visit meds:  Allergies  as of 07/19/2024   No Known Allergies   Med Rec must be completed prior to using this [facility]***        Discharge home in stable condition Infant Feeding: {Baby feeding:23562} Infant Disposition:{CHL IP OB HOME WITH FNUYZM:76418} Discharge instruction: per After Visit Summary and Postpartum booklet. Activity: Advance as tolerated. Pelvic rest for 6 weeks.  Diet: {OB diet:21111121} Anticipated Birth Control: {Birth Control:23956} Postpartum Appointment:{Outpatient follow up:23559} Additional Postpartum F/U: {PP Procedure:23957} Future Appointments: Future Appointments  Date Time Provider Department Center  12/25/2024   1:20 PM Antonette Angeline ORN, NP [facility] [address]   Follow up Visit:      07/19/2024 Harlene LITTIE Cisco, CNM

## 2024-07-19 NOTE — H&P (Signed)
 [facility] Labor & Delivery  History and Physical   HPI   Chief Complaint: delivered unattended at home  Kim Oliver is a 33 y.o. H7E7997 at [redacted]w[redacted]d who presents for precipitous birth at home. She reports mild contractions overnight that intensified at 7am. She felt a spontaneous urge to push, pushed twice and delivered her daughter, reports spontaneous cry. Placenta delivered at home. Brought in by ambulance.    Pregnancy Complications Patient Active Problem List   Diagnosis Date Noted   Pregnancy 07/19/2024   Precipitate labor, delivered, current hospitalization 07/19/2024   Vaginal delivery 07/19/2024   Postpartum care following vaginal delivery 07/19/2024   Kidney stone complicating pregnancy 03/22/2024   Marginal insertion of umbilical cord affecting management of mother 02/21/2024   Supervision of other normal pregnancy, antepartum 01/04/2024   Gastroesophageal reflux disease without esophagitis 02/24/2022   Overweight with body mass index (BMI) of 25 to 25.9 in adult 07/01/2021    Review of Systems A twelve point review of systems was negative except as stated in HPI.   HISTORY   Medications Medications Prior to Admission  Medication Sig Dispense Refill Last Dose/Taking   Prenatal MV-Min-FA-Omega-3 (PRENATAL GUMMIES/DHA & FA) 0.4-32.5 MG CHEW Chew 1 each by mouth daily. 90 tablet 2     Allergies has no known allergies.   OB History OB History  Gravida Para Term Preterm AB Living  2 2 2  0 0 2  SAB IAB Ectopic Multiple Live Births  0 0 0 0 2    # Outcome Date GA Lbr Len/2nd Weight Sex Type Anes PTL Lv  2 Term 07/19/24 [redacted]w[redacted]d  3770 g F Vag-Spont None  LIV     Name: Girl Brion Hedges  1 Term 07/16/22 [redacted]w[redacted]d / 00:29 3810 g M Vag-Spont EPI  LIV     Name: Pittinger,BOY Rashon     Apgar1: 7  Apgar5: 8    Past Medical History Past Medical History:  Diagnosis Date   Medical history non-contributory    Postpartum care following vaginal  delivery 08/03/2022    Past Surgical History Past Surgical History:  Procedure Laterality Date   NO PAST SURGERIES      Social History  reports that she has never smoked. She has never used smokeless tobacco. She reports that she does not drink alcohol and does not use drugs.   Family History family history includes Alcohol abuse in her paternal grandfather; COPD in her maternal grandfather; Cancer in her maternal grandfather; Dementia in her paternal grandfather.   PHYSICAL EXAM   Vitals:   07/19/24 1200 07/19/24 1409 07/19/24 1552 07/19/24 2003  BP: 116/65 118/74 (!) 103/59 101/62  Pulse: 70 65 69 64  Resp: 18 18 18 18   Temp: 98 F (36.7 C) 98.6 F (37 C) 98.2 F (36.8 C) (!) 97.4 F (36.3 C)  TempSrc: Oral Oral Oral Oral  SpO2:    100%    Constitutional: No acute distress, well appearing, and well nourished. Neurologic: She is alert and conversational.  Psychiatric: She has a normal mood and affect.  Musculoskeletal: Normal gait, grossly normal range of motion Cardiovascular: Normal rate.   Pulmonary/Chest: Normal work of breathing.  Gastrointestinal/Abdominal: Soft. Gravid. There is no tenderness.  Skin: Skin is warm and dry. No rash noted.  Genitourinary: Normal external female genitalia.    PRENATAL LABS FROM OB RESULTS CONSOLE  ABO, Rh: --/--/O POS (09/17 9057) Antibody: NEG (09/17 9057) Rubella: 2.85 (02/10 1216) Varicella: Reactive (02/10 1216) RPR: Non Reactive (  06/19 1009)  HBsAg: Negative (02/10 1216)  HC No results found for: HCVAB HIV: Non Reactive (06/19 1009)  GC:  Negative CT:  Negative  1h GTT:  Lab Results  Component Value Date   GLUCOSE 130 (H) 04/20/2024   3h GTT:   GBS: Negative/-- (08/21 0449)  ENCOUNTER LABS     Results for orders placed or performed during the hospital encounter of 07/19/24 (from the past 24 hours)  CBC     Status: Abnormal   Collection Time: 07/19/24  9:42 AM  Result Value Ref Range   WBC 17.8 (H) 4.0 -  10.5 K/uL   RBC 3.49 (L) 3.87 - 5.11 MIL/uL   Hemoglobin 11.7 (L) 12.0 - 15.0 g/dL   HCT 66.3 (L) 63.9 - 53.9 %   MCV 96.3 80.0 - 100.0 fL   MCH 33.5 26.0 - 34.0 pg   MCHC 34.8 30.0 - 36.0 g/dL   RDW 87.0 88.4 - 84.4 %   Platelets 210 150 - 400 K/uL   nRBC 0.0 0.0 - 0.2 %  Type and screen     Status: None   Collection Time: 07/19/24  9:42 AM  Result Value Ref Range   ABO/RH(D) O POS    Antibody Screen NEG    Sample Expiration      07/22/2024,2359 Performed at [facility], [address], [place], [place] [address]    ASSESSMENT AND PLAN   Kim Oliver is a 33 y.o. H7E7997 at [redacted]w[redacted]d with EDD: 07/15/2024, by Last Menstrual Period admitted for postpartum care.   Routine postpartum care Lactation support   GBS: negative Not indicated  Kidney stones in pregnancy - 2nd trimester resolved spontaneously  Labs/Immunizations: TDAP: Given prenatally. Flu: Given prenatally RSV: No Rubella: Immune Varicella: Immune    Postpartum Plan: - Feeding: Breast Milk - Contraception: plans vasectomy - Prenatal Care Provider: [facility]  Attending Dr. Leigh was immediately available for the care of the patient.

## 2024-07-20 ENCOUNTER — Telehealth: Payer: Self-pay | Admitting: Certified Nurse Midwife

## 2024-07-20 LAB — CBC
HCT: 29.5 % — ABNORMAL LOW (ref 36.0–46.0)
Hemoglobin: 10.3 g/dL — ABNORMAL LOW (ref 12.0–15.0)
MCH: 33.8 pg (ref 26.0–34.0)
MCHC: 34.9 g/dL (ref 30.0–36.0)
MCV: 96.7 fL (ref 80.0–100.0)
Platelets: 182 K/uL (ref 150–400)
RBC: 3.05 MIL/uL — ABNORMAL LOW (ref 3.87–5.11)
RDW: 13 % (ref 11.5–15.5)
WBC: 10.4 K/uL (ref 4.0–10.5)
nRBC: 0 % (ref 0.0–0.2)

## 2024-07-20 LAB — RPR: RPR Ser Ql: NONREACTIVE

## 2024-07-20 MED ORDER — ACETAMINOPHEN 500 MG PO TABS: 1000.0000 mg | ORAL_TABLET | Freq: Four times a day (QID) | ORAL | Status: AC | PRN

## 2024-07-20 MED ORDER — IBUPROFEN 600 MG PO TABS: 600.0000 mg | ORAL_TABLET | Freq: Four times a day (QID) | ORAL | Status: AC

## 2024-07-20 NOTE — Telephone Encounter (Signed)
 Reached out to pt to schedule 2 week virtual pp visit and 6 week pp in office visit. Left message for pt to call back to schedule.

## 2024-07-20 NOTE — Discharge Instructions (Signed)

## 2024-07-20 NOTE — Plan of Care (Signed)

## 2024-07-20 NOTE — Progress Notes (Signed)
Patient discharged home with infant. Discharge instructions and prescriptions given and reviewed with patient. Patient verbalized understanding. Escorted out by auxillary.  

## 2024-07-20 NOTE — Lactation Note (Signed)
 This note was copied from a baby's chart. Lactation Consultation Note  Patient Name: Kim Oliver Unijb'd Date: 07/20/2024 Age:31 hours Reason for consult: Follow-up assessment;Term;Other (Comment) (Discharge Education)   Maternal Data Follow up assessment and discharge education w/ a 26hr old baby Kim and parents.  Patient stated that feedings were going well but her nipples are sore.  Infant has had well above appropriate wet/dirty diapers on day one.   Feeding Mother's Current Feeding Choice: Breast Milk  Infant placed to breast and latches to only nipple.  LC provided tips and tricks to assist infant w/ establishing a deeper latch at the breast. Patient verbalized the latch felt a lot better.  LATCH Score Latch: Grasps breast easily, tongue down, lips flanged, rhythmical sucking.  Audible Swallowing: A few with stimulation  Type of Nipple: Everted at rest and after stimulation  Comfort (Breast/Nipple): Soft / non-tender  Hold (Positioning): No assistance needed to correctly position infant at breast.  LATCH Score: 9  Interventions Interventions: Breast feeding basics reviewed;Assisted with latch;Breast compression;Education;CDC milk storage guidelines  LC provided education on the following;  milk production expectations, hunger cues, day 1/2 wet/dirty diapers, hand expression, cluster feeding, benefits of STS and arousing infant for a feeding.  Lactation informed patient of feeding infant at least 8 or more times w/in a 24hr period but not exceeding 3hrs. Patient verbalized understanding.   Discharge Discharge Education: Engorgement and breast care;Outpatient recommendation  Education on engorgement prevention/treatment was discussed as well as breastmilk storage guidelines.  LC provided patient with a handout on breastmilk storage guidelines from [facility]. [facility] outpatient lactation services phone number written on the white board in the room.  Patient verbalized  understanding.  Consult Status Consult Status: Complete Follow-up type: Call as needed    Marguerite Jarboe S Kabella Cassidy 07/20/2024, 10:17 AM

## 2024-07-26 NOTE — Telephone Encounter (Signed)
 Pt has been scheduled for 2 week MyChart video visit on 08/03/2024 and scheduled for 6 week PP visit with DOROTHA Cisco on 08/29/2024.

## 2024-08-01 NOTE — Progress Notes (Unsigned)
   Virtual Visit via Video Note  I connected with Kim Oliver on 08/03/24 at  10:35 AM EDT by a video enabled telemedicine application and verified that I am speaking with the correct person using two identifiers.  Location: Patient: Home Provider: [facility] office   I discussed the limitations of evaluation and management by telemedicine and the availability of in person appointments. The patient expressed understanding and agreed to proceed.    History of Present Illness:   Kim Oliver is a 33 y.o. G77P2002 female who presents for a 2 week televisit for mood check. She is 2 weeks postpartum following a spontaneous vaginal delivery.  The delivery was at 40 gestational weeks.  Postpartum course has been well so far. Tired due to toddler needing her to sleep with him and he wakes when Kim Oliver wakes to eat. Endorses good appetite, denies bowel/bladder problems or perineal pain. Baby girl Kim Oliver is feeding by breast. Bleeding: light. Postpartum depression screening: negative. EDPS score is 4.      The following portions of the patient's history were reviewed and updated as appropriate: allergies, current medications, past family history, past medical history, past social history, past surgical history, and problem list.   Observations/Objective:   Last menstrual period 10/09/2023, currently breastfeeding. Gen App: NAD Psych: normal speech, affect. Good mood.        08/03/2024   10:36 AM 07/19/2024   12:00 PM 08/31/2022   11:25 AM 07/17/2022    4:00 AM  Edinburgh Postnatal Depression Scale Screening Tool  I have been able to laugh and see the funny side of things. 0 0 0 0  I have looked forward with enjoyment to things. 0 0 0 0  I have blamed myself unnecessarily when things went wrong. 0 0 1 1  I have been anxious or worried for no good reason. 2 0 2 0  I have felt scared or panicky for no good reason. 0 0 0 0  Things have been getting on top of me. 0 1 0 0  I have been so unhappy  that I have had difficulty sleeping. 0 0 0 0  I have felt sad or miserable. 1 0 0 0  I have been so unhappy that I have been crying. 1 0 0 0  The thought of harming myself has occurred to me. 0 0 0 0  Edinburgh Postnatal Depression Scale Total 4 1 3  1       Data saved with a previous flowsheet row definition         Assessment and Plan:   1. Postpartum care following vaginal delivery (Primary)  2. 2 weeks postpartum follow-up  3. Encounter for screening for maternal depression  Vasectomy for contraception.   Follow Up Instructions:    I discussed the assessment and treatment plan with the patient. The patient was provided an opportunity to ask questions and all were answered. The patient agreed with the plan and demonstrated an understanding of the instructions.   The patient was advised to call back or seek an in-person evaluation if the symptoms worsen or if the condition fails to improve as anticipated.   Kim Oliver, CNM

## 2024-08-03 ENCOUNTER — Telehealth (INDEPENDENT_AMBULATORY_CARE_PROVIDER_SITE_OTHER): Admitting: Certified Nurse Midwife

## 2024-08-03 ENCOUNTER — Encounter: Payer: Self-pay | Admitting: Certified Nurse Midwife

## 2024-08-03 DIAGNOSIS — Z1332 Encounter for screening for maternal depression: Secondary | ICD-10-CM

## 2024-08-29 ENCOUNTER — Ambulatory Visit: Admitting: Certified Nurse Midwife

## 2024-08-29 NOTE — Progress Notes (Deleted)
    Post Partum Visit Note  Kim Oliver is a 33 y.o. G51P2002 female who presents for a postpartum visit. She is 5 weeks postpartum following a normal spontaneous vaginal delivery.  I have fully reviewed the prenatal and intrapartum course. The delivery was at 40.4 gestational weeks.  Anesthesia: none. Postpartum course has been ***. Baby is doing well***. Baby is feeding by {breastmilk/bottle:69}. Bleeding {vag bleed:12292}. Bowel function is {normal:32111}. Bladder function is {normal:32111}. Patient {is/is not:9024} sexually active. Contraception method is {contraceptive method:5051}. Postpartum depression screening: {gen negative/positive:315881}.   The pregnancy intention screening data noted above was reviewed. Potential methods of contraception were discussed. The patient elected to proceed with No data recorded.    Health Maintenance Due  Topic Date Due   Hepatitis B Vaccines 19-59 Average Risk (1 of 3 - 19+ 3-dose series) Never done   HPV VACCINES (1 - 3-dose SCDM series) Never done   Influenza Vaccine  06/02/2024   COVID-19 Vaccine (1 - 2025-26 season) Never done    {Common ambulatory SmartLinks:19316}  Review of Systems {ros; complete:30496}  Objective:  LMP 10/09/2023    General:  {gen appearance:16600}   Breasts:  {desc; normal/abnormal/not indicated:14647}  Lungs: {lung exam:16931}  Heart:  {heart exam:5510}  Abdomen: {abdomen exam:16834}   Wound {Wound assessment:11097}  GU exam:  {desc; normal/abnormal/not indicated:14647}       Assessment:    There are no diagnoses linked to this encounter.  *** postpartum exam.   Plan:   Essential components of care per ACOG recommendations:  1.  Mood and well being: Patient with {gen negative/positive:315881} depression screening today. Reviewed local resources for support.  - Patient tobacco use? {tobacco use:25506}  - hx of drug use? {yes/no:25505}    2. Infant care and feeding:  -Patient currently  breastmilk feeding? {yes/no:25502}  -Social determinants of health (SDOH) reviewed in EPIC. No concerns***The following needs were identified***  3. Sexuality, contraception and birth spacing - Patient {DOES_DOES WNU:81435} want a pregnancy in the next year.  Desired family size is {NUMBER 1-10:22536} children.  - Reviewed reproductive life planning. Reviewed contraceptive methods based on pt preferences and effectiveness.  Patient desired {Upstream End Methods:24109} today.   - Discussed birth spacing of 18 months  4. Sleep and fatigue -Encouraged family/partner/community support of 4 hrs of uninterrupted sleep to help with mood and fatigue  5. Physical Recovery  - Discussed patients delivery and complications. She describes her labor as {description:25511} - Patient had a {CHL AMB DELIVERY:[phone]}. Patient had a {laceration:25518} laceration. Perineal healing reviewed. Patient expressed understanding - Patient has urinary incontinence? {yes/no:25515} - Patient {ACTION; IS/IS WNU:78978602} safe to resume physical and sexual activity  6.  Health Maintenance - HM due items addressed {Yes or If no, why not?:20788} - Last pap smear  Diagnosis  Date Value Ref Range Status  12/01/2021   Final   - Negative for intraepithelial lesion or malignancy (NILM)   Pap smear {done:10129} at today's visit.  -Breast Cancer screening indicated? {indicated:25516}  7. Chronic Disease/Pregnancy Condition follow up: {Follow up:25499}  - PCP follow up  Camelia Bars, LPN [facility], [facility]

## 2024-09-11 ENCOUNTER — Other Ambulatory Visit (HOSPITAL_COMMUNITY)
Admission: RE | Admit: 2024-09-11 | Discharge: 2024-09-11 | Disposition: A | Discharge status: Home / Self Care | Source: Ambulatory Visit | Attending: Certified Nurse Midwife | Admitting: Certified Nurse Midwife

## 2024-09-11 ENCOUNTER — Encounter: Payer: Self-pay | Admitting: Certified Nurse Midwife

## 2024-09-11 ENCOUNTER — Ambulatory Visit

## 2024-09-11 ENCOUNTER — Ambulatory Visit (INDEPENDENT_AMBULATORY_CARE_PROVIDER_SITE_OTHER): Admitting: Certified Nurse Midwife

## 2024-09-11 DIAGNOSIS — D219 Benign neoplasm of connective and other soft tissue, unspecified: Secondary | ICD-10-CM

## 2024-09-11 DIAGNOSIS — Z113 Encounter for screening for infections with a predominantly sexual mode of transmission: Secondary | ICD-10-CM

## 2024-09-11 DIAGNOSIS — N852 Hypertrophy of uterus: Secondary | ICD-10-CM | POA: Diagnosis not present

## 2024-09-11 DIAGNOSIS — O923 Agalactia: Secondary | ICD-10-CM

## 2024-09-11 NOTE — Progress Notes (Signed)
 Post Partum Visit Note  Kim Oliver is a 33 y.o. G67P2002 female who presents for a postpartum visit. She is 8 weeks postpartum following a normal spontaneous vaginal delivery.  I have fully reviewed the prenatal and intrapartum course. The delivery was at 40+4 gestational weeks and occurred precipitously at home.  Anesthesia: none. Postpartum course has been complicated by social stressors-FOB disclosed new relationship and desire to end marriage at 3w PP. She has continued to have moderate to heavy bleeding, it slowed last week for a bit but has increased again. She also reports a low milk supply and is supplementing with formula which is different from her first baby. Baby is feeding by both breast and bottle - Enfamil AR. Bowel function is normal. Bladder function is normal. Patient is not sexually active. Contraception method is abstinence. Postpartum depression screening: positive.   Upstream - 09/11/24 1633       Pregnancy Intention Screening   Does the patient want to become pregnant in the next year? No    Does the patient's partner want to become pregnant in the next year? N/A    Would the patient like to discuss contraceptive options today? No      Contraception Wrap Up   Current Method Abstinence    End Method Abstinence    Contraception Counseling Provided No    How was the end contraceptive method provided? N/A         The pregnancy intention screening data noted above was reviewed. Potential methods of contraception were discussed. The patient elected to proceed with Abstinence.   Edinburgh Postnatal Depression Scale - 09/11/24 1633       Edinburgh Postnatal Depression Scale:  In the Past 7 Days   I have been able to laugh and see the funny side of things. 2    I have looked forward with enjoyment to things. 2    I have blamed myself unnecessarily when things went wrong. 1    I have been anxious or worried for no good reason. 1    I have felt scared or panicky  for no good reason. 2    Things have been getting on top of me. 2    I have been so unhappy that I have had difficulty sleeping. 2    I have felt sad or miserable. 2    I have been so unhappy that I have been crying. 2    The thought of harming myself has occurred to me. 0    Edinburgh Postnatal Depression Scale Total 16          Health Maintenance Due  Topic Date Due   Hepatitis B Vaccines 19-59 Average Risk (1 of 3 - 19+ 3-dose series) Never done   HPV VACCINES (1 - 3-dose SCDM series) Never done   Influenza Vaccine  06/02/2024   COVID-19 Vaccine (1 - 2025-26 season) Never done    The following portions of the patient's history were reviewed and updated as appropriate: allergies, current medications, past family history, past medical history, past social history, past surgical history, and problem list.  Review of Systems Pertinent items are noted in HPI.  Objective:  BP 117/77   Pulse 90   Wt 157 lb 9.6 oz (71.5 kg)   Breastfeeding Yes   BMI 31.83 kg/m    General:  alert, cooperative, appears stated age, and no distress  Lungs: clear to auscultation bilaterally  Heart:  regular rate and rhythm, S1, S2  normal, no murmur, click, rub or gallop  GU exam:  NEFG, vagina pink, well rugated, moderate amount bright red blood in vault, cervix without lesions, negative CMT. Uterus enlarged, ~10w size, nontender       Assessment:   1. Postpartum care following vaginal delivery (Primary)  2. Screening examination for STI - Cervicovaginal ancillary only  3. Abnormal uterine bleeding, postpartum - US  PELVIS TRANSVAGINAL NON-OB (TV ONLY); Future  4. Fibroids    Plan:   Essential components of care per ACOG recommendations:  1.  Mood and well being: Patient with positive depression screening today. Reviewed local resources for support. Has support from in-laws & her mother, her husband is staying with her to help with the kids but is not interested in continuing their  marital relationship. - Patient tobacco use? No.   - hx of drug use? No.    2. Infant care and feeding:  -Patient currently breastmilk feeding? Yes, supply decreased. IBCLC referral placed.  -Social determinants of health (SDOH) reviewed in EPIC. No concerns  3. Sexuality, contraception and birth spacing - Patient does not want a pregnancy in the next year.  Desired family size is 2 children.  - Reviewed reproductive life planning. Husband received vasectomy during pregnancy, [name] plans continued abstinence.   4. Sleep and fatigue -Encouraged family/community support, she is visiting family this weekend.  5. Physical Recovery  - Discussed patients delivery and complications. Surprised by quick birth. - Patient had a Vaginal, no problems at delivery. Patient had no laceration. Perineal healing reviewed. Patient expressed understanding - Patient has urinary incontinence? No. - Patient is not safe to resume physical and sexual activity due to continued bleeding.  6.  Health Maintenance - HM due items addressed Yes - Last pap smear  Diagnosis  Date Value Ref Range Status  12/01/2021   Final   - Negative for intraepithelial lesion or malignancy (NILM)   Pap smear not done at today's visit. Due January 2026 -Breast Cancer screening indicated? No.   7. Chronic Disease/Pregnancy Condition follow up: Fibroids-if bleeding does not resolve in next 2-3w would recommend considering POP to suppress bleeding. - Ultrasound performed, prelim report reviewed, calcifying fibroids x3 noted with enlarged uterus at 11cm, no evidence of retained products. - PCP follow up  Harlene LITTIE Cisco, CNM [facility], [facility]

## 2024-09-11 NOTE — Patient Instructions (Signed)
 Postpartum Care After Vaginal Delivery The following information offers guidance about how to care for yourself from the time you deliver your baby to 6-12 weeks after delivery (postpartum period). If you have problems or questions, contact your health care provider for more specific instructions. Follow these instructions at home: Vaginal bleeding It is normal to have vaginal bleeding (lochia) after delivery. Wear a sanitary pad for bleeding and discharge. During the first week after delivery, the amount and appearance of lochia is often similar to a menstrual period. Over the next few weeks, it will gradually decrease to a dry, yellow-brown discharge. For most women, lochia stops completely by 4-6 weeks after delivery, but it can vary. Change your sanitary pads frequently. Watch for any changes in your flow, such as: An increase in bleeding. A change in color. Large blood clots. If you pass a blood clot the size of an egg or larger, contact your healthcare provider.. Do not use tampons or douches until your health care provider approves. If you are not breastfeeding, your period should return 6-8 weeks after delivery. If you are feeding your baby breast milk only, your period may not return until you stop breastfeeding. Perineal care  Keep the area between the vagina and the anus (perineum) clean and dry. Use medicated pads and pain-relieving sprays or creams as told. If you had a tear or a surgical cut in the perineum (episiotomy), check the area for signs of infection until you are healed. Check for: More redness, swelling, or pain. Pus or a bad smell. You may be given a squirt bottle to use instead of wiping to clean the perineum area after you use the bathroom. Pat the area gently to dry it. To relieve pain caused by an episiotomy, a tear, or swollen veins in the anus (hemorrhoids), take a warm sitz bath 2-4 times a day, or as many as told by your health care provider. You can use a  bathtub or a basin you put over the toilet as a sitz bath. Breast care In the first few days after delivery, your breasts may feel heavy, full, and uncomfortable (breast engorgement). Milk may also leak from your breasts. Ask your health care provider about ways to help relieve the discomfort. If you breastfeed: Wear a bra that supports your breasts and fits well. Use breast pads to absorb milk that leaks. Keep your nipples clean and dry. Apply creams and ointments as told. You may have uterine contractions every time you breastfeed for up to several weeks after delivery. This helps your uterus return to its normal size. If you have any problems with breastfeeding, tell your health care provider or lactation specialist. If you do not breastfeed: Avoid touching your breasts. Do not squeeze out (express) milk. Doing this can make your breasts produce more milk. Wear a bra that supports your breasts and fits well. Use cold packs to help with swelling. Talk to your health care provider about what over-the-counter medicines can help with pain. Intimacy and sexuality Ask your health care provider when you can engage in sexual activity. This may depend upon: Your risk of infection. How fast you are healing. Your comfort and desire to engage in sexual activity. You are able to get pregnant after delivery, even if you have not had your period. Talk with your health care provider about birth control (contraception) or family planning. Medicines Take over-the-counter and prescription medicines only as told by your health care provider. Take an over-the-counter stool softener to help  ease bowel movements as told by your health care provider. If you were prescribed antibiotics, take them as told by your health care provider. Do not stop using the antibiotic even if you start to feel better. Review all previous and current prescriptions to check for the possible transfer into your breast milk. Ask your  health care provider or lactation specialist for help if needed. Activity Gradually return to your normal activities as told by your health care provider. Rest as much as possible. Try to nap while your baby is sleeping. Eating and drinking  Drink enough fluid to keep your urine pale yellow. To help prevent or relieve constipation, eat high-fiber foods every day. Choose healthy eating to support breastfeeding and healing. Take your prenatal vitamins until your health care provider tells you to stop. General recommendations Do not use any products that contain nicotine or tobacco. These products include cigarettes, chewing tobacco, and vaping devices, such as e-cigarettes. These can delay healing. If you need help quitting, ask your health care provider. Secondhand smoke exposure is dangerous for your baby. It can increase the risk of illness and sudden infant death syndrome (SIDS). Nicotine and other chemicals pass through breast milk to the baby. Alcohol use can be dangerous during the postpartum period. Drinking alcohol may impair your judgment and ability to safely care for your baby. Not drinking alcohol is the safest option for breastfeeding mothers. Alcohol passes through breast milk to the baby. Alcohol can be damaging to your baby's development, growth, and sleep patterns. If you breastfeed and drink alcohol, wait at least 2 hours after a single drink before feeding your baby. Do not take medicines or drugs that are not prescribed to you, especially if you breastfeed. Visit your health care provider for a postpartum checkup within the first 3-6 weeks after delivery. Complete a comprehensive postpartum visit no later than 12 weeks after delivery. Keep all follow-up visits. Your health care provider will check your healing after delivery and also check your blood pressure. Where to find more information [place] Department of Health and [employer] of Women's [employer]:  [employer]: acog.org Contact a health care provider if: You feel unusually sad or worried. Your breasts become red, painful, or hard. You have nausea and vomiting and are unable to eat or drink anything for 24 hours. You have a fever or other signs of infection. You have bleeding that soaks through one pad an hour or you have blood clots the size of an egg or larger. You have a severe headache that does not go away or you have a headache with vision changes. Get help right away if: You have chest pain or difficulty breathing. You have sudden, severe leg pain. You faint or have a seizure. You have thoughts about hurting yourself or your baby. You have any of the following symptoms and you were unable to reach your health care provider: A fever or other signs of infection. Bleeding that is soaking through one pad an hour or you have blood clots the size of an egg or larger. A severe headache that does not go away or you have a headache with vision changes. These symptoms may be an emergency. Get help right away. Call 911. Do not wait to see if the symptoms will go away. Do not drive yourself to the hospital. Get help if you ever feel like you may hurt yourself or others, or have thoughts about taking your own life. Go  to your nearest emergency room or: Call 911. Call the National Suicide Prevention Lifeline at 315-702-5467 or 988. This is open 24 hours a day. Text the Crisis Text Line at 507-826-2619. This information is not intended to replace advice given to you by your health care provider. Make sure you discuss any questions you have with your health care provider. Document Revised: 07/30/2022 Document Reviewed: 01/20/2022 Elsevier Patient Education  2024 [employer].

## 2024-09-13 LAB — CERVICOVAGINAL ANCILLARY ONLY
Chlamydia: NEGATIVE
Comment: NEGATIVE
Comment: NEGATIVE
Comment: NORMAL
Neisseria Gonorrhea: NEGATIVE
Trichomonas: NEGATIVE

## 2024-09-15 ENCOUNTER — Telehealth: Payer: Self-pay | Admitting: Lactation Services

## 2024-09-15 NOTE — Telephone Encounter (Signed)
 LC called to set up a outpatient lactation appointment w/ patient from a referral sent to the office.  Mom stated that she is only pumping 1x a day, and infant may go to breast but stay on for only 5 minutes.  Mom inquired if stress from things happening in personal life could affect supply.  Mom also stated that she will be starting therapy this week.  Lactation informed mom to reach back out to the lactation office if she would like to get assistance later on.  Mom verbalized understanding and thanks to [facility].

## 2024-09-17 ENCOUNTER — Ambulatory Visit: Payer: Self-pay | Admitting: Certified Nurse Midwife

## 2024-09-25 DIAGNOSIS — F4323 Adjustment disorder with mixed anxiety and depressed mood: Secondary | ICD-10-CM | POA: Diagnosis not present

## 2024-10-02 DIAGNOSIS — F4323 Adjustment disorder with mixed anxiety and depressed mood: Secondary | ICD-10-CM | POA: Diagnosis not present

## 2024-10-09 DIAGNOSIS — F4323 Adjustment disorder with mixed anxiety and depressed mood: Secondary | ICD-10-CM | POA: Diagnosis not present

## 2024-10-17 ENCOUNTER — Ambulatory Visit: Payer: Self-pay

## 2024-10-17 DIAGNOSIS — R070 Pain in throat: Secondary | ICD-10-CM | POA: Diagnosis not present

## 2024-10-17 DIAGNOSIS — J101 Influenza due to other identified influenza virus with other respiratory manifestations: Secondary | ICD-10-CM | POA: Diagnosis not present

## 2024-10-17 DIAGNOSIS — R059 Cough, unspecified: Secondary | ICD-10-CM | POA: Diagnosis not present

## 2024-10-17 NOTE — Telephone Encounter (Signed)
 FYI Only or Action Required?: FYI only for provider: UC advised.  Patient was last seen in primary care on 12/24/2023 by Antonette Angeline ORN, NP.  Called Nurse Triage reporting URI.  Symptoms began several days ago.  Interventions attempted: Rest, hydration, or home remedies.  Symptoms are: gradually worsening.  Triage Disposition: See Physician Within 24 Hours  Patient/caregiver understands and will follow disposition?: Yes  Message from [facility] G sent at 10/17/2024 12:31 PM EST  Reason for Triage: headache, body aches, sore throat, cough w/mucus, fever - worsening   Reason for Disposition  Fever present > 3 days (72 hours)  Answer Assessment - Initial Assessment Questions Starting Saturday- productive cough, congestion, body aches, fever, sore throat. Denies SOB, CP. Has had a few seconds of dizziness upon standing but resolved quickly.   Patient will go to [facility] for assessment. Not wanting to go to alternative office and wanting to be seen sooner than 12/18. ED precautions understood.   1. ONSET: When did the nasal discharge start?      Since Saturday 2. AMOUNT: How much discharge is there?      Nasal congestion-  3. COUGH: Do you have a cough? If Yes, ask: Describe the color of your mucus. (e.g., clear, white, yellow, green)     Frequent Productive cough with brown sputum.   4. RESPIRATORY DISTRESS: Describe your breathing.      After coughing some sob  5. FEVER: Do you have a fever? If Yes, ask: What is your temperature, how was it measured, and when did it start?     Hot and cold through the night- body feels hot inside 6. SEVERITY: Overall, how bad are you feeling right now? (e.g., doesn't interfere with normal activities, staying home from school/work, staying in bed)      Hard to do day to day  7. OTHER SYMPTOMS: Do you have any other symptoms? (e.g., earache, mouth sores, sore throat, wheezing)     Mild dizzy from sitting to standing (few seconds), sore  throat, headache, body aches, weakness 8. PREGNANCY: Is there any chance you are pregnant? When was your last menstrual period?     Denies  Protocols used: Common Cold-A-AH

## 2024-12-25 ENCOUNTER — Ambulatory Visit: Payer: BC Managed Care – PPO | Admitting: Internal Medicine
